# Patient Record
Sex: Female | Born: 1990 | Race: Black or African American | Hispanic: No | Marital: Single | Smoking: Current some day smoker
Health system: Southern US, Community
[De-identification: ages and names within clinical notes are randomized; demographics above are authoritative.]

## PROBLEM LIST (undated history)

## (undated) DIAGNOSIS — N739 Female pelvic inflammatory disease, unspecified: Secondary | ICD-10-CM

---
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
## 2013-07-13 ENCOUNTER — Emergency Department (HOSPITAL_BASED_OUTPATIENT_CLINIC_OR_DEPARTMENT_OTHER)
Admission: EM | Admit: 2013-07-13 | Discharge: 2013-07-13 | Disposition: A | Discharge status: Home / Self Care | Payer: Self-pay | Attending: Emergency Medicine | Admitting: Emergency Medicine

## 2013-07-13 ENCOUNTER — Encounter (HOSPITAL_BASED_OUTPATIENT_CLINIC_OR_DEPARTMENT_OTHER): Payer: Self-pay | Admitting: Emergency Medicine

## 2013-07-13 DIAGNOSIS — N643 Galactorrhea not associated with childbirth: Secondary | ICD-10-CM

## 2013-07-13 DIAGNOSIS — F17200 Nicotine dependence, unspecified, uncomplicated: Secondary | ICD-10-CM | POA: Insufficient documentation

## 2013-07-13 DIAGNOSIS — Z3202 Encounter for pregnancy test, result negative: Secondary | ICD-10-CM | POA: Insufficient documentation

## 2013-07-13 LAB — URINALYSIS, ROUTINE W REFLEX MICROSCOPIC
Ketones, ur: NEGATIVE mg/dL
Nitrite: NEGATIVE
Specific Gravity, Urine: 1.025 (ref 1.005–1.030)
pH: 6 (ref 5.0–8.0)

## 2013-07-13 LAB — URINE MICROSCOPIC-ADD ON

## 2013-07-13 NOTE — ED Notes (Signed)
Pt c/o breast tenderness and leakage x 2 weeks

## 2013-07-13 NOTE — ED Provider Notes (Signed)
CSN: [id number]     Arrival date & time 07/13/13  1336 History   First MD Initiated Contact with Patient 07/13/13 1434     Chief Complaint  Patient presents with  . Breast Discharge   (Consider location/radiation/quality/duration/timing/severity/associated sxs/prior Treatment) HPI Comments: Pt complains of breast feeling tight and drainage from her breast.  Pt reports drainage when she squeezes breast.  Pt denies fever or chills. No vaginal discharge.  Not pregnant.    The history is provided by the patient. No language interpreter was used.    History reviewed. No pertinent past medical history. History reviewed. No pertinent past surgical history. History reviewed. No pertinent family history. History  Substance Use Topics  . Smoking status: Current Every Day Smoker    Types: Cigarettes  . Smokeless tobacco: Not on file  . Alcohol Use: No   OB History   Grav Para Term Preterm Abortions TAB SAB Ect Mult Living                 Review of Systems  All other systems reviewed and are negative.    Allergies  Review of patient's allergies indicates no known allergies.  Home Medications  No current outpatient prescriptions on file. BP 125/63  Pulse 82  Temp(Src) 97.9 F (36.6 C) (Oral)  Resp 16  Ht 5\' 3"  (1.6 m)  Wt 150 lb (68.04 kg)  BMI 26.58 kg/m2  SpO2 100%  LMP 07/10/2013 Physical Exam  Nursing note and vitals reviewed. Constitutional: She appears well-developed and well-nourished.  Eyes: Conjunctivae and EOM are normal. Pupils are equal, round, and reactive to light.  Neck: Normal range of motion. Neck supple.  Cardiovascular: Normal rate and normal heart sounds.   Pulmonary/Chest: Effort normal.  Abdominal: Soft. Bowel sounds are normal.  Musculoskeletal: Normal range of motion.  Small amount of clear drainage. clear  Neurological: She is alert.  Skin: Skin is warm.    ED Course  Procedures (including critical care time) Labs Review Labs Reviewed   URINALYSIS, ROUTINE W REFLEX MICROSCOPIC - Abnormal; Notable for the following:    APPearance CLOUDY (*)    Hgb urine dipstick LARGE (*)    Leukocytes, UA SMALL (*)    All other components within normal limits  URINE MICROSCOPIC-ADD ON - Abnormal; Notable for the following:    Squamous Epithelial / LPF FEW (*)    Bacteria, UA MANY (*)    All other components within normal limits  PREGNANCY, URINE   Imaging Review No results found.  EKG Interpretation   None       MDM   1. Galactorrhea    Pt referred to breast center.     Elson Areas, PA-C 07/13/13 1522  Lonia Skinner [place], [place] 07/13/13 [phone]

## 2013-07-13 NOTE — ED Provider Notes (Signed)
Medical screening examination/treatment/procedure(s) were performed by non-physician practitioner and as supervising physician I was immediately available for consultation/collaboration.  EKG Interpretation   None         Shanna Cisco, MD 07/13/13 1537

## 2013-11-11 DIAGNOSIS — N739 Female pelvic inflammatory disease, unspecified: Secondary | ICD-10-CM

## 2013-11-11 HISTORY — DX: Female pelvic inflammatory disease, unspecified: N73.9

## 2014-03-27 ENCOUNTER — Encounter (HOSPITAL_BASED_OUTPATIENT_CLINIC_OR_DEPARTMENT_OTHER): Payer: Self-pay | Admitting: Emergency Medicine

## 2014-03-27 ENCOUNTER — Emergency Department (HOSPITAL_BASED_OUTPATIENT_CLINIC_OR_DEPARTMENT_OTHER)
Admission: EM | Admit: 2014-03-27 | Discharge: 2014-03-27 | Disposition: A | Discharge status: Home / Self Care | Payer: Self-pay | Attending: Emergency Medicine | Admitting: Emergency Medicine

## 2014-03-27 DIAGNOSIS — F17200 Nicotine dependence, unspecified, uncomplicated: Secondary | ICD-10-CM | POA: Insufficient documentation

## 2014-03-27 DIAGNOSIS — R42 Dizziness and giddiness: Secondary | ICD-10-CM | POA: Insufficient documentation

## 2014-03-27 DIAGNOSIS — R61 Generalized hyperhidrosis: Secondary | ICD-10-CM | POA: Insufficient documentation

## 2014-03-27 DIAGNOSIS — Z3202 Encounter for pregnancy test, result negative: Secondary | ICD-10-CM | POA: Insufficient documentation

## 2014-03-27 LAB — CBC WITH DIFFERENTIAL/PLATELET
BASOS ABS: 0.1 10*3/uL (ref 0.0–0.1)
Basophils Relative: 0 % (ref 0–1)
EOS ABS: 0.4 10*3/uL (ref 0.0–0.7)
EOS PCT: 3 % (ref 0–5)
HEMATOCRIT: 37.1 % (ref 36.0–46.0)
Hemoglobin: 12.2 g/dL (ref 12.0–15.0)
LYMPHS ABS: 3.4 10*3/uL (ref 0.7–4.0)
LYMPHS PCT: 28 % (ref 12–46)
MCH: 28.1 pg (ref 26.0–34.0)
MCHC: 32.9 g/dL (ref 30.0–36.0)
MCV: 85.5 fL (ref 78.0–100.0)
MONO ABS: 0.9 10*3/uL (ref 0.1–1.0)
Monocytes Relative: 7 % (ref 3–12)
Neutro Abs: 7.4 10*3/uL (ref 1.7–7.7)
Neutrophils Relative %: 62 % (ref 43–77)
PLATELETS: 225 10*3/uL (ref 150–400)
RBC: 4.34 MIL/uL (ref 3.87–5.11)
RDW: 13.7 % (ref 11.5–15.5)
WBC: 12.1 10*3/uL — AB (ref 4.0–10.5)

## 2014-03-27 LAB — BASIC METABOLIC PANEL
Anion gap: 10 (ref 5–15)
BUN: 6 mg/dL (ref 6–23)
CALCIUM: 9.6 mg/dL (ref 8.4–10.5)
CO2: 25 meq/L (ref 19–32)
CREATININE: 0.8 mg/dL (ref 0.50–1.10)
Chloride: 104 mEq/L (ref 96–112)
GFR calc Af Amer: >90 mL/min (ref >90)
GLUCOSE: 94 mg/dL (ref 70–99)
Potassium: 3.8 mEq/L (ref 3.7–5.3)
Sodium: 139 mEq/L (ref 137–147)

## 2014-03-27 LAB — URINALYSIS, ROUTINE W REFLEX MICROSCOPIC
BILIRUBIN URINE: NEGATIVE
Glucose, UA: NEGATIVE mg/dL
Ketones, ur: NEGATIVE mg/dL
NITRITE: NEGATIVE
PH: 5 (ref 5.0–8.0)
Protein, ur: NEGATIVE mg/dL
SPECIFIC GRAVITY, URINE: 1.029 (ref 1.005–1.030)
UROBILINOGEN UA: 0.2 mg/dL (ref 0.0–1.0)

## 2014-03-27 LAB — URINE MICROSCOPIC-ADD ON

## 2014-03-27 LAB — PREGNANCY, URINE: Preg Test, Ur: NEGATIVE

## 2014-03-27 MED ORDER — SODIUM CHLORIDE 0.9 % IV BOLUS (SEPSIS)
1000.0000 mL | Freq: Once | INTRAVENOUS | Status: AC
  Administered 2014-03-27: 1000 mL via INTRAVENOUS

## 2014-03-27 NOTE — ED Notes (Signed)
Dizziness while at work today. Drove herself here.

## 2014-03-27 NOTE — ED Provider Notes (Signed)
CSN: [id number]     Arrival date & time 03/27/14  1725 History   First MD Initiated Contact with Patient 03/27/14 1802     Chief Complaint  Patient presents with  . Dizziness     (Consider location/radiation/quality/duration/timing/severity/associated sxs/prior Treatment) HPI Comments: Nancy Harper is a 23 y.o. female with no significant PMHx who presents to the ED with complaints of dizziness that occurred at work PTA. Pt states she felt light headed and "hot" as well as "sweaty" when she was standing at work, which resolved after arriving to the ED and laying down approx 1 hr after it began. States she used a cold towel behind her neck which helped her feel better. Has never had an episode like this before. 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 States she has not been drinking much water recently. Ate a biscuit this morning and then a can of vienna sausages prior to arriving at work but has not eaten any other items during the day today. Denies any fevers, chills, HA, CP, SOB, palpitations, cough, ongoing URI symptoms, ear pain/discharge, tinnitus, vertigo, vision changes, abd pain, N/V/D/C, hematuria, dysuria, vaginal discharge/bleeding, weakness, or numbness. Denies LOC or syncopal episode although she states when this occurred she felt her vision was tunneling but it quickly resolved when she sat down. Denies ETOH or drug use. Denies current pregnancy.   Patient is a 23 y.o. female presenting with dizziness. The history is provided by the patient. No language interpreter was used.  Dizziness Quality:  Lightheadedness Severity:  Mild Onset quality:  Sudden Duration:  1 hour Progression:  Resolved Chronicity:  New Context: physical activity and standing up   Context: not when bending over, not with bowel movement, not with ear pain, not with eye movement, not with head  movement, not with inactivity, not with loss of consciousness, not with medication and not when urinating   Relieved by:  Lying down Worsened by:  Standing up Ineffective treatments:  None tried Associated symptoms: no blood in stool, no chest pain, no diarrhea, no headaches, no hearing loss, no nausea, no palpitations, no shortness of breath, no syncope, no tinnitus, no vision changes, no vomiting and no weakness   Risk factors: no anemia, no hx of vertigo and no new medications     History reviewed. No pertinent past medical history. History reviewed. No pertinent past surgical history. No family history on file. History  Substance Use Topics  . Smoking status: Current Every Day Smoker    Types: Cigarettes  . Smokeless tobacco: Not on file  . Alcohol Use: No   OB History   Grav Para Term Preterm Abortions TAB SAB Ect Mult Living                 Review of Systems  Constitutional: Positive for diaphoresis (during lightheaded episode, now resolved). Negative for fever, chills and fatigue.  HENT: Negative for ear discharge, ear pain, hearing loss, rhinorrhea, sinus pressure, sore throat and tinnitus.   Eyes: Negative for visual disturbance.  Respiratory: Negative for cough, chest tightness and shortness of breath.   Cardiovascular: Negative for chest pain, palpitations and syncope.  Gastrointestinal: Negative for nausea, vomiting, abdominal pain, diarrhea, constipation and blood in stool.  Genitourinary: Negative for dysuria, urgency, frequency, hematuria, flank pain, decreased urine volume, vaginal bleeding, vaginal discharge, menstrual problem and pelvic pain.  Musculoskeletal: Negative  for arthralgias, back pain, joint swelling, myalgias, neck pain and neck stiffness.  Skin: Negative for rash.  Neurological: Positive for dizziness and light-headedness. Negative for syncope, facial asymmetry, weakness, numbness and headaches.  Psychiatric/Behavioral: Negative for confusion.  10  Systems reviewed and are negative for acute change except as noted in the HPI.     Allergies  Review of patient's allergies indicates no known allergies.  Home Medications   Prior to Admission medications   Not on File   BP 140/84  Pulse 70  Temp(Src) 97.9 F (36.6 C) (Oral)  Resp 20  Ht  (1.6 m)  Wt 165 lb (74.844 kg)  BMI 29.24 kg/m2  SpO2 100%  LMP 03/13/2014 Physical Exam  Nursing note and vitals reviewed. Constitutional: She is oriented to person, place, and time. Vital signs are normal. She appears well-developed and well-nourished. No distress.  NAD, VSS, afebrile  HENT:  Head: Normocephalic and atraumatic.  Mouth/Throat: Uvula is midline, oropharynx is clear and moist and mucous membranes are normal.  Eyes: Conjunctivae and EOM are normal. Pupils are equal, round, and reactive to light. Right eye exhibits no discharge. Left eye exhibits no discharge.  Neck: Normal range of motion. Neck supple. No spinous process tenderness and no muscular tenderness present. No rigidity. Normal range of motion present.  Cardiovascular: Normal rate, regular rhythm, normal heart sounds and intact distal pulses.  Exam reveals no gallop and no friction rub.   No murmur heard. Pulmonary/Chest: Effort normal and breath sounds normal. No respiratory distress. She has no decreased breath sounds. She has no wheezes. She has no rhonchi. She has no rales.  Abdominal: Soft. Normal appearance and bowel sounds are normal. She exhibits no distension. There is no tenderness. There is no rigidity, no rebound, no guarding, no CVA tenderness and no tenderness at McBurney's point.  Soft, NT/ND, +BS throughout, no r/g/r, neg mcburney's, neg CVA TTP  Musculoskeletal: Normal range of motion.  FROM in all extremities, MAE x4, gait nonataxic, strength and sensation at baseline without any deficits  Neurological: She is alert and oriented to person, place, and time. She has normal strength. No cranial nerve  deficit or sensory deficit. Coordination and gait normal.  CN 2-12 grossly intact, sensation and strength at baseline and without deficits, gait WNL  Skin: Skin is warm, dry and intact. No rash noted.  Psychiatric: She has a normal mood and affect.    ED Course  Procedures (including critical care time)  19:18:57 Orthostatic Vital Signs  Orthostatic Lying - BP- Lying: 118/63 mmHg ; Pulse- Lying: 64  Orthostatic Sitting - BP- Sitting: 115/74 mmHg ; Pulse- Sitting: 70  Orthostatic Standing at 0 minutes - BP- Standing at 0 minutes: 102/59 mmHg ; Pulse- Standing at 0 minutes: 82  21:30 Orthostatic Vital Signs  Orthostatic Lying - BP- Lying: 118/66 mmHg ; Pulse- Lying: 72  Orthostatic Sitting - BP- Sitting: 113/67 mmHg ; Pulse- Sitting: 76  Orthostatic Standing at 0 minutes - BP- Standing at 0 minutes: 105/62 mmHg ; Pulse- Standing at 0 minutes: 74   Labs Review Labs Reviewed  URINALYSIS, ROUTINE W REFLEX MICROSCOPIC - Abnormal; Notable for the following:    APPearance CLOUDY (*)    Hgb urine dipstick MODERATE (*)    Leukocytes, UA SMALL (*)    All other components within normal limits  URINE MICROSCOPIC-ADD ON - Abnormal; Notable for the following:    Squamous Epithelial / LPF FEW (*)    All other components within normal limits  CBC WITH DIFFERENTIAL - Abnormal; Notable for the following:    WBC 12.1 (*)    All other components within normal limits  PREGNANCY, URINE  BASIC METABOLIC PANEL    Imaging Review No results found.   EKG Interpretation None      MDM   Final diagnoses:  Orthostatic dizziness    23y/o female with dizziness at work resolving with laying down. No neuro deficits, neg HITS exam. Will check orthostatics and give fluids. U/A showing dirty specimen, and pt asymptomatic for urinary or vaginal complaints. Doubt UTI. Upreg neg. Will obtain basic labs to eval for any abnormalities with electrolytes or anemia. Will reassess after labs/orthostatics.  9:45  PM Orthostatic VS improved after 1L bolus, pt feels better. Without urinary complaints, doubt U/A is infectious. CBC showing WBC 12 likely from recent viral URI pt reports having last week. No anemia or lyte abnormals. Doubt any neurologic condition needing emergent imaging, doubt any cardiac abnormalities causing dizziness. Likely just dehydration. Will have pt f/up with PCP from resource guide in 1 week for ongoing management. Discussed adequate hydration. I explained the diagnosis and have given explicit precautions to return to the ER including for any other new or worsening symptoms. The patient understands and accepts the medical plan as it's been dictated and I have answered their questions. Discharge instructions concerning home care and prescriptions have been given. The patient is STABLE and is discharged to home in good condition.  BP 140/84  Pulse 70  Temp(Src) 97.9 F (36.6 C) (Oral)  Resp 20  Ht  (1.6 m)  Wt 165 lb (74.844 kg)  BMI 29.24 kg/m2  SpO2 100%  LMP 03/13/2014  Meds ordered this encounter  Medications  . sodium chloride 0.9 % bolus 1,000 mL    Sig:       [place] Pringle, PA-C 03/27/14 2223

## 2014-03-27 NOTE — ED Provider Notes (Signed)
Medical screening examination/treatment/procedure(s) were performed by non-physician practitioner and as supervising physician I was immediately available for consultation/collaboration.   EKG Interpretation None       Ethelda Chick, MD 03/27/14 2224

## 2014-03-27 NOTE — Discharge Instructions (Signed)
Stay well hydrated, and eat regular meals. Find a regular doctor to follow up with in 1 week for ongoing management of any continued dizziness. Return to the ER for changes or worsening symptoms.   Dizziness Dizziness is a common problem. It is a feeling of unsteadiness or light-headedness. You may feel like you are about to faint. Dizziness can lead to injury if you stumble or fall. A person of any age group can suffer from dizziness, but dizziness is more common in older adults. CAUSES  Dizziness can be caused by many different things, including:  Middle ear problems.  Standing for too long.  Infections.  An allergic reaction.  Aging.  An emotional response to something, such as the sight of blood.  Side effects of medicines.  Tiredness.  Problems with circulation or blood pressure.  Excessive use of alcohol or medicines, or illegal drug use.  Breathing too fast (hyperventilation).  An irregular heart rhythm (arrhythmia).  A low red blood cell count (anemia).  Pregnancy.  Vomiting, diarrhea, fever, or other illnesses that cause body fluid loss (dehydration).  Diseases or conditions such as Parkinson's disease, high blood pressure (hypertension), diabetes, and thyroid problems.  Exposure to extreme heat. DIAGNOSIS  Your health care provider will ask about your symptoms, perform a physical exam, and perform an electrocardiogram (ECG) to record the electrical activity of your heart. Your health care provider may also perform other heart or blood tests to determine the cause of your dizziness. These may include:  Transthoracic echocardiogram (TTE). During echocardiography, sound waves are used to evaluate how blood flows through your heart.  Transesophageal echocardiogram (TEE).  Cardiac monitoring. This allows your health care provider to monitor your heart rate and rhythm in real time.  Holter monitor. This is a portable device that records your heartbeat and can  help diagnose heart arrhythmias. It allows your health care provider to track your heart activity for several days if needed.  Stress tests by exercise or by giving medicine that makes the heart beat faster. TREATMENT  Treatment of dizziness depends on the cause of your symptoms and can vary greatly. HOME CARE INSTRUCTIONS   Drink enough fluids to keep your urine clear or pale yellow. This is especially important in very hot weather. In older adults, it is also important in cold weather.  Take your medicine exactly as directed if your dizziness is caused by medicines. When taking blood pressure medicines, it is especially important to get up slowly.  Rise slowly from chairs and steady yourself until you feel okay.  In the morning, first sit up on the side of the bed. When you feel okay, stand slowly while holding onto something until you know your balance is fine.  Move your legs often if you need to stand in one place for a long time. Tighten and relax your muscles in your legs while standing.  Have someone stay with you for 1-2 days if dizziness continues to be a problem. Do this until you feel you are well enough to stay alone. Have the person call your health care provider if he or she notices changes in you that are concerning.  Do not drive or use heavy machinery if you feel dizzy.  Do not drink alcohol. SEEK IMMEDIATE MEDICAL CARE IF:   Your dizziness or light-headedness gets worse.  You feel nauseous or vomit.  You have problems talking, walking, or using your arms, hands, or legs.  You feel weak.  You are not thinking clearly  or you have trouble forming sentences. It may take a friend or family member to notice this.  You have chest pain, abdominal pain, shortness of breath, or sweating.  Your vision changes.  You notice any bleeding.  You have side effects from medicine that seems to be getting worse rather than better. MAKE SURE YOU:   Understand these  instructions.  Will watch your condition.  Will get help right away if you are not doing well or get worse. Document Released: 12/24/2000 Document Revised: 07/05/2013 Document Reviewed: 01/17/2011 [facility] Patient Information 2015 [place], [place]. This information is not intended to replace advice given to you by your health care provider. Make sure you discuss any questions you have with your health care provider.  Emergency Department Resource Guide 1) Find a Doctor and Pay Out of Pocket Although you won't have to find out who is covered by your insurance plan, it is a good idea to ask around and get recommendations. You will then need to call the office and see if the doctor you have chosen will accept you as a new patient and what types of options they offer for patients who are self-pay. Some doctors offer discounts or will set up payment plans for their patients who do not have insurance, but you will need to ask so you aren't surprised when you get to your appointment.  2) Contact Your Local Health Department Not all health departments have doctors that can see patients for sick visits, but many do, so it is worth a call to see if yours does. If you don't know where your local health department is, you can check in your phone book. The CDC also has a tool to help you locate your state's health department, and many state websites also have listings of all of their local health departments.  3) Find a Walk-in Clinic If your illness is not likely to be very severe or complicated, you may want to try a walk in clinic. These are popping up all over the country in pharmacies, drugstores, and shopping centers. They're usually staffed by nurse practitioners or physician assistants that have been trained to treat common illnesses and complaints. They're usually fairly quick and inexpensive. However, if you have serious medical issues or chronic medical problems, these are probably not your best  option.  No Primary Care Doctor: - Call Health Connect at  [phone] - they can help you locate a primary care doctor that  accepts your insurance, provides certain services, etc. - Physician Referral Service- [phone]  Chronic Pain Problems: Organization         Address  Phone   Notes  [facility]  [phone] Patients need to be referred by their primary care doctor.   Medication Assistance: Organization         Address  Phone   Notes  [facility] Medication [facility] [address] [place] [place], [place] [address] [phone] --Must be a resident of [facility] -- Must have NO insurance coverage whatsoever (no Medicaid/ Medicare, etc.) -- The pt. MUST have a primary care doctor that directs their care regularly and follows them in the community   [facility]  [phone]   [employer]  [phone]    Agencies that provide inexpensive medical care: Organization         Address  Phone   Notes  [facility]  [phone]   [facility]    (  [phone]   [facility] [address] [place], [place] [address] [phone]   [facility] [address], [place] [phone]   [facility]    [phone]   [facility]    [phone]   [facility]  [address], [place] Phone:  [phone], Fax:  [phone] Hours of Operation:  9 am - 6 pm, M-F.  Also accepts Medicaid/Medicare and self-pay.  [facility]  [address], [place] Phone: [phone], Fax: [phone]. Hours of Operation:  8:30 am - 5:30 pm, M-F.  Also accepts Medicaid and self-pay.  [facility] [place] [address] [place] Phone: [phone]   [facility] [address] [place]asha Bence [place], [place] [phone], Ext. [phone] Mondays & Thursdays: 7-9 AM.  First 15  patients are seen on a first come, first serve basis.    Medicaid-accepting [facility] Providers:  Organization         Address  Phone   Notes  [facility] [address], [place] [phone] Also accepts self-pay patients.  [facility] [address]urell [place]s [place], [place]  [phone]   [facility] [address], [place] [phone]   [facility] [address], [place] [phone]   Renaye Rakers [address], [place]   [phone] Only accepts [place] patients after they have their name applied to their card.   Self-Pay (no insurance) in [facility]:  Organization         Address  Phone   Notes  Sickle Cell Patients, [facility] Internal Medicine [address] [place], [place] [phone]   [facility] [address] [place], [place] [phone]   [facility]  [address], [place] [phone]   [facility]/Dr. Osei-Bonsu  [address], [place] or [address], [place] [phone] Phone number for both [place] and [place] locations is the same.  [facility] [address], [place] [phone]   [facility] [address], [place] or [address] [phone]   [facility] [address] [place] [phone], phone; [phone], fax Sees patients 1st and 3rd Saturday of every month.  Must not qualify for public or private insurance (i.e. Medicaid, Medicare, [place] Health Choice, Veterans' Benefits)  Household income should be no more than 200% of the poverty level The clinic cannot treat you if you are pregnant or think you are pregnant  Sexually transmitted diseases are not treated at the clinic.    Dental  Care: Organization         Address  Phone  Notes  [facility] Department of [facility] [address] [place], [place] [phone] Accepts children up to age 82 who are enrolled in [place] or [place] Health Choice; pregnant women with a Medicaid card; and children who have applied for Medicaid or [place] Health Choice, but were declined, whose parents can pay a reduced fee at time of service.  [facility] Department of [facility]  [address], [place] [phone] Accepts children up  to age 9 who are enrolled in Medicaid or [place] Health Choice; pregnant women with a Medicaid card; and children who have applied for Medicaid or  Health Choice, but were declined, whose parents can pay a reduced fee at time of service.  [facility]  [address] [place], [place] [phone] Patients are seen by appointment only. Walk-ins are not accepted. [facility] will see patients 73 years of age and older. Monday - Tuesday (8am-5pm) Most Wednesdays (8:30-5pm) $30 per visit, cash only  [facility]  [address], [facility] [phone] Patients are seen by appointment only. Walk-ins are not accepted. [facility] will see patients 23 years of age and older. One Wednesday Evening (Monthly: Volunteer Based).  $30 per visit, cash only  [employer]  [phone] for adults; Children under age 59, call Graduate Pediatric Dentistry at [phone]. Children aged 53-14, please call [phone] to request a pediatric application.  Dental services are provided in all areas of dental care including fillings, crowns and bridges, complete and partial dentures, implants, gum treatment, root canals, and extractions. Preventive care is also provided. Treatment is provided to both adults and children. Patients are selected via a lottery and there is often a waiting list.   [facility] [address] [place]  [phone] [email or web address]   [facility] [address] [place], [place] [phone], Ext. [phone] Second and Fourth Thursday of each month, opens at 6:30 AM; Clinic ends at 9 AM.  Patients are seen on a first-come first-served basis, and a limited number are seen during each clinic.   [facility]  [address] [place], [place] [phone]   Eligibility Requirements You must have lived in [place], [place], or [place] counties for at least the last three months.   You cannot be eligible for state or federal sponsored [place], including [employer], [place], or [employer].   You generally cannot be eligible for healthcare insurance through your employer.    How to apply: Eligibility screenings are held every Tuesday and Wednesday afternoon from 1:00 pm until 4:00 pm. You do not need an appointment for the interview!  [facility] [address], [place], [place] [phone]   [facility]  [phone]   [facility]  [phone]   [facility] Health [facility]  [phone]

## 2014-03-27 NOTE — ED Notes (Signed)
Pt. Reports being sexually active with no birth control

## 2014-06-09 ENCOUNTER — Encounter (HOSPITAL_BASED_OUTPATIENT_CLINIC_OR_DEPARTMENT_OTHER): Payer: Self-pay

## 2014-06-09 ENCOUNTER — Inpatient Hospital Stay (HOSPITAL_BASED_OUTPATIENT_CLINIC_OR_DEPARTMENT_OTHER)
Admission: EM | Admit: 2014-06-09 | Discharge: 2014-06-11 | DRG: 759 | Disposition: A | Discharge status: Home / Self Care | Payer: Self-pay | Attending: Obstetrics & Gynecology | Admitting: Obstetrics & Gynecology

## 2014-06-09 ENCOUNTER — Emergency Department (HOSPITAL_BASED_OUTPATIENT_CLINIC_OR_DEPARTMENT_OTHER): Payer: Self-pay

## 2014-06-09 DIAGNOSIS — D649 Anemia, unspecified: Secondary | ICD-10-CM | POA: Diagnosis present

## 2014-06-09 DIAGNOSIS — B9689 Other specified bacterial agents as the cause of diseases classified elsewhere: Secondary | ICD-10-CM | POA: Diagnosis present

## 2014-06-09 DIAGNOSIS — N7093 Salpingitis and oophoritis, unspecified: Principal | ICD-10-CM | POA: Diagnosis present

## 2014-06-09 DIAGNOSIS — D72829 Elevated white blood cell count, unspecified: Secondary | ICD-10-CM | POA: Diagnosis present

## 2014-06-09 DIAGNOSIS — F17210 Nicotine dependence, cigarettes, uncomplicated: Secondary | ICD-10-CM | POA: Diagnosis present

## 2014-06-09 DIAGNOSIS — D6489 Other specified anemias: Secondary | ICD-10-CM | POA: Diagnosis present

## 2014-06-09 DIAGNOSIS — N760 Acute vaginitis: Secondary | ICD-10-CM | POA: Diagnosis present

## 2014-06-09 DIAGNOSIS — R1031 Right lower quadrant pain: Secondary | ICD-10-CM | POA: Diagnosis present

## 2014-06-09 HISTORY — DX: Female pelvic inflammatory disease, unspecified: N73.9

## 2014-06-09 LAB — URINE MICROSCOPIC-ADD ON

## 2014-06-09 LAB — CBC WITH DIFFERENTIAL/PLATELET
Basophils Absolute: 0 10*3/uL (ref 0.0–0.1)
Basophils Relative: 0 % (ref 0–1)
EOS ABS: 0 10*3/uL (ref 0.0–0.7)
Eosinophils Relative: 0 % (ref 0–5)
HCT: 34.3 % — ABNORMAL LOW (ref 36.0–46.0)
Hemoglobin: 11.4 g/dL — ABNORMAL LOW (ref 12.0–15.0)
LYMPHS PCT: 10 % — AB (ref 12–46)
Lymphs Abs: 1.3 10*3/uL (ref 0.7–4.0)
MCH: 27.9 pg (ref 26.0–34.0)
MCHC: 33.2 g/dL (ref 30.0–36.0)
MCV: 84.1 fL (ref 78.0–100.0)
Monocytes Absolute: 0.6 10*3/uL (ref 0.1–1.0)
Monocytes Relative: 4 % (ref 3–12)
NEUTROS ABS: 11.6 10*3/uL — AB (ref 1.7–7.7)
Neutrophils Relative %: 86 % — ABNORMAL HIGH (ref 43–77)
PLATELETS: 243 10*3/uL (ref 150–400)
RBC: 4.08 MIL/uL (ref 3.87–5.11)
RDW: 13.2 % (ref 11.5–15.5)
WBC: 13.5 10*3/uL — AB (ref 4.0–10.5)

## 2014-06-09 LAB — WET PREP, GENITAL
Trich, Wet Prep: NONE SEEN
YEAST WET PREP: NONE SEEN

## 2014-06-09 LAB — URINALYSIS, ROUTINE W REFLEX MICROSCOPIC
BILIRUBIN URINE: NEGATIVE
GLUCOSE, UA: NEGATIVE mg/dL
KETONES UR: 15 mg/dL — AB
Leukocytes, UA: NEGATIVE
Nitrite: NEGATIVE
PROTEIN: 30 mg/dL — AB
Specific Gravity, Urine: 1.028 (ref 1.005–1.030)
Urobilinogen, UA: 0.2 mg/dL (ref 0.0–1.0)
pH: 6.5 (ref 5.0–8.0)

## 2014-06-09 LAB — COMPREHENSIVE METABOLIC PANEL
ALT: 14 U/L (ref 0–35)
AST: 18 U/L (ref 0–37)
Albumin: 4 g/dL (ref 3.5–5.2)
Alkaline Phosphatase: 90 U/L (ref 39–117)
Anion gap: 14 (ref 5–15)
BUN: 6 mg/dL (ref 6–23)
CO2: 22 meq/L (ref 19–32)
Calcium: 9 mg/dL (ref 8.4–10.5)
Chloride: 104 mEq/L (ref 96–112)
Creatinine, Ser: 0.5 mg/dL (ref 0.50–1.10)
GFR calc non Af Amer: >90 mL/min (ref >90)
Glucose, Bld: 102 mg/dL — ABNORMAL HIGH (ref 70–99)
Potassium: 3.9 mEq/L (ref 3.7–5.3)
SODIUM: 140 meq/L (ref 137–147)
TOTAL PROTEIN: 8.1 g/dL (ref 6.0–8.3)
Total Bilirubin: 0.3 mg/dL (ref 0.3–1.2)

## 2014-06-09 LAB — LIPASE, BLOOD: Lipase: 17 U/L (ref 11–59)

## 2014-06-09 LAB — PREGNANCY, URINE: Preg Test, Ur: NEGATIVE

## 2014-06-09 MED ORDER — SODIUM CHLORIDE 0.9 % IV BOLUS (SEPSIS)
1000.0000 mL | INTRAVENOUS | Status: AC
  Administered 2014-06-09: 1000 mL via INTRAVENOUS

## 2014-06-09 MED ORDER — DIPHENHYDRAMINE HCL 50 MG/ML IJ SOLN
25.0000 mg | Freq: Once | INTRAMUSCULAR | Status: AC
  Administered 2014-06-09: 25 mg via INTRAVENOUS
  Filled 2014-06-09: qty 1

## 2014-06-09 MED ORDER — DEXTROSE 5 % IV SOLN
500.0000 mg | Freq: Once | INTRAVENOUS | Status: AC
  Administered 2014-06-09: 500 mg via INTRAVENOUS
  Filled 2014-06-09: qty 500

## 2014-06-09 MED ORDER — IOHEXOL 300 MG/ML  SOLN
25.0000 mL | Freq: Once | INTRAMUSCULAR | Status: AC | PRN
  Administered 2014-06-09: 25 mL via ORAL

## 2014-06-09 MED ORDER — CEFTRIAXONE SODIUM 1 G IJ SOLR
INTRAMUSCULAR | Status: AC
  Filled 2014-06-09: qty 10

## 2014-06-09 MED ORDER — IOHEXOL 300 MG/ML  SOLN
100.0000 mL | Freq: Once | INTRAMUSCULAR | Status: AC | PRN
  Administered 2014-06-09: 100 mL via INTRAVENOUS

## 2014-06-09 MED ORDER — DEXTROSE 5 % IV SOLN
1.0000 g | Freq: Once | INTRAVENOUS | Status: AC
  Administered 2014-06-09: 1 g via INTRAVENOUS

## 2014-06-09 MED ORDER — KETOROLAC TROMETHAMINE 30 MG/ML IJ SOLN
30.0000 mg | Freq: Once | INTRAMUSCULAR | Status: AC
  Administered 2014-06-09: 30 mg via INTRAVENOUS
  Filled 2014-06-09: qty 1

## 2014-06-09 MED ORDER — METOCLOPRAMIDE HCL 5 MG/ML IJ SOLN
10.0000 mg | Freq: Once | INTRAMUSCULAR | Status: AC
  Administered 2014-06-09: 10 mg via INTRAVENOUS
  Filled 2014-06-09: qty 2

## 2014-06-09 NOTE — ED Provider Notes (Signed)
CSN: [id number]     Arrival date & time 06/09/14  1359 History   First MD Initiated Contact with Patient 06/09/14 1532     Chief Complaint  Patient presents with  . Abdominal Pain   (Consider location/radiation/quality/duration/timing/severity/associated sxs/prior Treatment) HPI  Nancy Harper is a 23 yo female presenting with report of nausea vomiting and abd pain.  She reports yesterday while eating dinner she began feeling nauseated and vomited x 1 episode. This am she woke up with right sided abd pain that radiated to her right flank and back. She describes this pain as constant and sharp pain, and rates it 8-9/10.  She notes walking seems to make it better, and lying on right side make it worse.  She reports vomitied x 3 times today. Last bowel movement was today and soft.  She is currently on her menstrual period.  She also reports a history of an abdominal abscess 8 months ago that required [facility]x.  She denies gross hematuria, fever or chills, vaginal symptoms or dysuria.    History reviewed. No pertinent past medical history. Past Surgical History  Procedure Laterality Date  . Vaginal septum resection    . Abdominal surgery     No family history on file. History  Substance Use Topics  . Smoking status: Current Every Day Smoker    Types: Cigarettes  . Smokeless tobacco: Not on file  . Alcohol Use: Yes   OB History    No data available     Review of Systems  Constitutional: Negative for fever and chills.  HENT: Negative for sore throat.   Eyes: Negative for visual disturbance.  Respiratory: Negative for cough and shortness of breath.   Cardiovascular: Negative for chest pain and leg swelling.  Gastrointestinal: Positive for nausea, vomiting and abdominal pain. Negative for diarrhea.  Genitourinary: Positive for flank pain. Negative for dysuria.  Musculoskeletal: Positive for back pain. Negative for myalgias.  Skin: Negative for rash.    Neurological: Negative for weakness, numbness and headaches.      Allergies  Review of patient's allergies indicates no known allergies.  Home Medications   Prior to Admission medications   Not on File   BP 119/75 mmHg  Pulse 79  Temp(Src) 98.4 F (36.9 C) (Oral)  Resp 18  Ht 5\' 3"  (1.6 m)  Wt 168 lb (76.204 kg)  BMI 29.77 kg/m2  SpO2 100%  LMP 06/08/2014 Physical Exam  Constitutional: She appears well-developed and well-nourished. No distress.  HENT:  Head: Normocephalic and atraumatic.  Mouth/Throat: Oropharynx is clear and moist. No oropharyngeal exudate.  Eyes: Conjunctivae are normal.  Neck: Neck supple. No thyromegaly present.  Cardiovascular: Normal rate, regular rhythm and intact distal pulses.   Pulmonary/Chest: Effort normal and breath sounds normal. No respiratory distress. She has no wheezes. She has no rales. She exhibits no tenderness.  Abdominal: Soft. She exhibits no distension and no mass. There is tenderness in the suprapubic area. There is CVA tenderness. There is no rigidity, no rebound, no guarding, no tenderness at McBurney's point and negative Murphy's sign.    Genitourinary: Vagina normal. Cervix exhibits no motion tenderness. Right adnexum displays no tenderness. Left adnexum displays no tenderness. No vaginal discharge found.  Musculoskeletal: She exhibits no tenderness.  Lymphadenopathy:    She has no cervical adenopathy.  Neurological: She is alert.  Skin: Skin is warm and dry. No rash noted. She is not diaphoretic.  Psychiatric: She has a normal mood and affect.  Nursing note and vitals reviewed.   ED Course  Procedures (including critical care time) Labs Review Labs Reviewed  URINALYSIS, ROUTINE W REFLEX MICROSCOPIC - Abnormal; Notable for the following:    Color, Urine RED (*)    APPearance CLOUDY (*)    Hgb urine dipstick LARGE (*)    Ketones, ur 15 (*)    Protein, ur 30 (*)    All other components within normal limits  URINE  MICROSCOPIC-ADD ON - Abnormal; Notable for the following:    Bacteria, UA FEW (*)    All other components within normal limits  CBC WITH DIFFERENTIAL - Abnormal; Notable for the following:    WBC 13.5 (*)    Hemoglobin 11.4 (*)    HCT 34.3 (*)    Neutrophils Relative % 86 (*)    Neutro Abs 11.6 (*)    Lymphocytes Relative 10 (*)    All other components within normal limits  COMPREHENSIVE METABOLIC PANEL - Abnormal; Notable for the following:    Glucose, Bld 102 (*)    All other components within normal limits  PREGNANCY, URINE  LIPASE, BLOOD    Imaging Review Ct Abdomen Pelvis W Contrast  06/09/2014   CLINICAL DATA:  23 year old female with right-sided abdominal pain accompanied by nausea and vomiting.  EXAM: CT ABDOMEN AND PELVIS WITH CONTRAST  TECHNIQUE: Multidetector CT imaging of the abdomen and pelvis was performed using the standard protocol following bolus administration of intravenous contrast.  CONTRAST:  25mL OMNIPAQUE IOHEXOL 300 MG/ML SOLN, OMNIPAQUE IOHEXOL 300 MG/ML SOLN  COMPARISON:  Prior CT scan of the abdomen and pelvis 09/19/2013  FINDINGS: Lower Chest: The lung bases are clear. Visualized cardiac structures are within normal limits for size. No pericardial effusion. Unremarkable visualized distal thoracic esophagus.  Abdomen: Unremarkable CT appearance of the stomach, duodenum, spleen, adrenal glands and pancreas. Normal hepatic contour and morphology. No discrete hepatic lesion. Gallbladder is unremarkable. No intra or extrahepatic biliary ductal dilatation. Unremarkable appearance of the bilateral kidneys. No focal solid lesion, hydronephrosis or nephrolithiasis. No evidence of obstruction or focal bowel wall thickening. Normal appendix in the right lower quadrant. The terminal ileum is unremarkable.  Pelvis: Complex peripherally enhancing tubular fluid collection centered in the region of the right adnexum most consistent with a hydro or pyosalpinx. Given the  relative hyper enhancement of the mucosal lining, pyosalpinx is favored. There is an inseparable 5.2 x 4.7 x 5.9 cm intermediate attenuation cystic collection. This appears to be arising from the ovary. There is a separate small peripherally enhancing fluid collection also within the ovary which may represent a corpus luteum. There is a smaller tubular fluid collection in the left aspect of the anatomic pelvis and a smaller 2.8 x 2.2 cm cystic collection in the left ovary. The uterus itself is unremarkable. No free fluid.  Bones/Soft Tissues: No acute fracture or aggressive appearing lytic or blastic osseous lesion.  Vascular: No significant atherosclerotic vascular disease, aneurysmal dilatation or acute abnormality.  IMPRESSION: 1. CT findings are most consistent with bilateral right worse than left tubo-ovarian abscesses. Consider further evaluation with pelvic ultrasound. 2. The appendix is normal.   Electronically Signed   By: Malachy Moan M.D.   On: 06/09/2014 19:21     EKG Interpretation None      MDM   Final diagnoses:  Abdominal pain, RLQ  TOA (tubo-ovarian abscess)   23 yo female with RLQ abd pain and vomiting.  CBC, CMP, Lipase, UA, Upreg, NS bolus and pain meds.  Discussed case with Dr. Rosalia Hammersay.  Will also perform pelvic exam and CT abd/pelvis  Labs reviewed, WBC elevated: 13.5.  CT resulted: bilat tubo-ovarian abscess. IV rocephin and azithromycin ordered.  Consulted Dr. Criselda PeachesMullen (hospitalist) for admission, pt accepted [facility]. The patient appears reasonably stabilized for admission considering the current resources, flow, and capabilities available in the ED at this time, and I doubt any further screening and/or treatment in the ED prior to admission.     Filed Vitals:   06/09/14 1417 06/09/14 1634 06/09/14 1952 06/09/14 2023  BP: 119/75 124/61 109/62 111/60  Pulse: 79 83 69 79  Temp: 98.4 F (36.9 C)  98.2 F (36.8 C) 98.2 F (36.8 C)  TempSrc: Oral  Oral  Oral  Resp: 18 18 16 18   Height: 5\' 3"  (1.6 m)     Weight: 168 lb (76.204 kg)     SpO2: 100% 100% 100% 99%   Meds given in ED:  Medications  sodium chloride 0.9 % bolus 1,000 mL (1,000 mLs Intravenous New Bag/Given 06/09/14 1655)  ketorolac (TORADOL) 30 MG/ML injection 30 mg (30 mg Intravenous Given 06/09/14 1655)  metoCLOPramide (REGLAN) injection 10 mg (10 mg Intravenous Given 06/09/14 1656)  diphenhydrAMINE (BENADRYL) injection 25 mg (25 mg Intravenous Given 06/09/14 1656)    New Prescriptions   No medications on file       Harle BattiestElizabeth Trini Christiansen, NP 06/14/14 1215  Hilario Quarryanielle S Ray, MD 06/17/14 [id number]

## 2014-06-09 NOTE — ED Notes (Signed)
C/o abd pain, right flank pain x today

## 2014-06-09 NOTE — Progress Notes (Signed)
Report taken from PeostaMisty, RN at [facility].

## 2014-06-09 NOTE — Progress Notes (Signed)
Patient arrived to 5w30 via carelink. Patient alert and oriented x 4. In no distress. Skin intact. No complaints of pain at this time. IV intact. Pt oriented to room and unit. Call bell within reach, pt has understanding of how to get into contact with nursing staff. [facility]. Awaiting MD orders.

## 2014-06-09 NOTE — Progress Notes (Signed)
Called re: Admission for Nancy Harper.   Patient with bilateral R worse than left tubo-ovarian abscess on CT scan.  Started on Abx.  Vital signs stable.  WBC 13.5.  [facility].  First line Abx would be 2nd gen cephalosporin and doxycycline.    Debe CoderMULLEN, Rotunda Worden, MD

## 2014-06-10 ENCOUNTER — Encounter (HOSPITAL_COMMUNITY): Payer: Self-pay | Admitting: *Deleted

## 2014-06-10 ENCOUNTER — Inpatient Hospital Stay (HOSPITAL_COMMUNITY): Payer: Self-pay

## 2014-06-10 DIAGNOSIS — D649 Anemia, unspecified: Secondary | ICD-10-CM

## 2014-06-10 DIAGNOSIS — N760 Acute vaginitis: Secondary | ICD-10-CM | POA: Diagnosis present

## 2014-06-10 DIAGNOSIS — B9689 Other specified bacterial agents as the cause of diseases classified elsewhere: Secondary | ICD-10-CM | POA: Diagnosis present

## 2014-06-10 DIAGNOSIS — A499 Bacterial infection, unspecified: Secondary | ICD-10-CM

## 2014-06-10 DIAGNOSIS — D72829 Elevated white blood cell count, unspecified: Secondary | ICD-10-CM

## 2014-06-10 DIAGNOSIS — N7093 Salpingitis and oophoritis, unspecified: Principal | ICD-10-CM

## 2014-06-10 DIAGNOSIS — R1031 Right lower quadrant pain: Secondary | ICD-10-CM

## 2014-06-10 LAB — BASIC METABOLIC PANEL
ANION GAP: 14 (ref 5–15)
BUN: 3 mg/dL — AB (ref 6–23)
CALCIUM: 8.1 mg/dL — AB (ref 8.4–10.5)
CHLORIDE: 105 meq/L (ref 96–112)
CO2: 20 meq/L (ref 19–32)
CREATININE: 0.51 mg/dL (ref 0.50–1.10)
GFR calc Af Amer: >90 mL/min (ref >90)
GFR calc non Af Amer: >90 mL/min (ref >90)
Glucose, Bld: 157 mg/dL — ABNORMAL HIGH (ref 70–99)
Potassium: 3.3 mEq/L — ABNORMAL LOW (ref 3.7–5.3)
Sodium: 139 mEq/L (ref 137–147)

## 2014-06-10 LAB — CBC
HEMATOCRIT: 32.7 % — AB (ref 36.0–46.0)
HEMOGLOBIN: 10.9 g/dL — AB (ref 12.0–15.0)
MCH: 28.4 pg (ref 26.0–34.0)
MCHC: 33.3 g/dL (ref 30.0–36.0)
MCV: 85.2 fL (ref 78.0–100.0)
Platelets: 228 10*3/uL (ref 150–400)
RBC: 3.84 MIL/uL — ABNORMAL LOW (ref 3.87–5.11)
RDW: 13.7 % (ref 11.5–15.5)
WBC: 11.1 10*3/uL — ABNORMAL HIGH (ref 4.0–10.5)

## 2014-06-10 MED ORDER — METRONIDAZOLE 500 MG PO TABS
500.0000 mg | ORAL_TABLET | Freq: Two times a day (BID) | ORAL | Status: DC
  Administered 2014-06-10 (×2): 500 mg via ORAL
  Filled 2014-06-10 (×3): qty 1

## 2014-06-10 MED ORDER — IBUPROFEN 600 MG PO TABS: 600.0000 mg | ORAL_TABLET | Freq: Four times a day (QID) | ORAL | Status: DC | PRN

## 2014-06-10 MED ORDER — ACETAMINOPHEN 650 MG RE SUPP: 650.0000 mg | Freq: Four times a day (QID) | RECTAL | Status: DC | PRN

## 2014-06-10 MED ORDER — DEXTROSE 5 % IV SOLN
2.0000 g | Freq: Two times a day (BID) | INTRAVENOUS | Status: DC
  Administered 2014-06-10: 2 g via INTRAVENOUS
  Filled 2014-06-10 (×2): qty 2

## 2014-06-10 MED ORDER — SODIUM CHLORIDE 0.9 % IJ SOLN: 3.0000 mL | Freq: Two times a day (BID) | INTRAMUSCULAR | Status: DC

## 2014-06-10 MED ORDER — OXYCODONE HCL 5 MG PO TABS
5.0000 mg | ORAL_TABLET | ORAL | Status: DC | PRN
  Administered 2014-06-10: 5 mg via ORAL
  Filled 2014-06-10: qty 1

## 2014-06-10 MED ORDER — ONDANSETRON HCL 4 MG/2ML IJ SOLN: 4.0000 mg | Freq: Four times a day (QID) | INTRAMUSCULAR | Status: DC | PRN

## 2014-06-10 MED ORDER — ONDANSETRON HCL 4 MG PO TABS: 4.0000 mg | ORAL_TABLET | Freq: Four times a day (QID) | ORAL | Status: DC | PRN

## 2014-06-10 MED ORDER — DIPHENHYDRAMINE HCL 50 MG/ML IJ SOLN: 25.0000 mg | Freq: Four times a day (QID) | INTRAMUSCULAR | Status: DC | PRN

## 2014-06-10 MED ORDER — ACETAMINOPHEN 325 MG PO TABS
650.0000 mg | ORAL_TABLET | Freq: Four times a day (QID) | ORAL | Status: DC | PRN
  Administered 2014-06-10: 650 mg via ORAL
  Filled 2014-06-10: qty 2

## 2014-06-10 MED ORDER — ENOXAPARIN SODIUM 40 MG/0.4ML ~~LOC~~ SOLN
40.0000 mg | Freq: Every day | SUBCUTANEOUS | Status: DC
  Filled 2014-06-10: qty 0.4

## 2014-06-10 MED ORDER — METRONIDAZOLE 500 MG PO TABS
500.0000 mg | ORAL_TABLET | Freq: Three times a day (TID) | ORAL | Status: DC
  Administered 2014-06-10 (×2): 500 mg via ORAL
  Filled 2014-06-10 (×5): qty 1

## 2014-06-10 MED ORDER — PIPERACILLIN-TAZOBACTAM 3.375 G IVPB
3.3750 g | Freq: Three times a day (TID) | INTRAVENOUS | Status: DC
  Administered 2014-06-10 – 2014-06-11 (×3): 3.375 g via INTRAVENOUS
  Filled 2014-06-10 (×6): qty 50

## 2014-06-10 MED ORDER — SODIUM CHLORIDE 0.9 % IJ SOLN: 3.0000 mL | INTRAMUSCULAR | Status: DC | PRN

## 2014-06-10 MED ORDER — IBUPROFEN 600 MG PO TABS
600.0000 mg | ORAL_TABLET | Freq: Four times a day (QID) | ORAL | Status: DC | PRN
  Administered 2014-06-10: 600 mg via ORAL
  Filled 2014-06-10: qty 1

## 2014-06-10 MED ORDER — OXYCODONE HCL 5 MG PO TABS: 5.0000 mg | ORAL_TABLET | ORAL | Status: DC | PRN

## 2014-06-10 MED ORDER — SODIUM CHLORIDE 0.9 % IV SOLN: 250.0000 mL | INTRAVENOUS | Status: DC | PRN

## 2014-06-10 MED ORDER — DEXTROSE 5 % IV SOLN
100.0000 mg | Freq: Two times a day (BID) | INTRAVENOUS | Status: DC
  Administered 2014-06-10: 100 mg via INTRAVENOUS
  Filled 2014-06-10 (×2): qty 100

## 2014-06-10 MED ORDER — HYDROMORPHONE HCL 1 MG/ML IJ SOLN: 0.5000 mg | INTRAMUSCULAR | Status: DC | PRN

## 2014-06-10 MED ORDER — ALUM & MAG HYDROXIDE-SIMETH 200-200-20 MG/5ML PO SUSP: 30.0000 mL | Freq: Four times a day (QID) | ORAL | Status: DC | PRN

## 2014-06-10 MED ORDER — POTASSIUM CHLORIDE CRYS ER 20 MEQ PO TBCR: 40.0000 meq | EXTENDED_RELEASE_TABLET | Freq: Once | ORAL | Status: DC

## 2014-06-10 MED ORDER — SODIUM CHLORIDE 0.9 % IV SOLN
INTRAVENOUS | Status: DC
  Administered 2014-06-10: 01:00:00 via INTRAVENOUS

## 2014-06-10 MED ORDER — METOCLOPRAMIDE HCL 5 MG/ML IJ SOLN: 10.0000 mg | Freq: Four times a day (QID) | INTRAMUSCULAR | Status: DC | PRN

## 2014-06-10 NOTE — Plan of Care (Signed)
Problem: Phase I Progression Outcomes Goal: Pain controlled with appropriate interventions Outcome: Progressing     

## 2014-06-10 NOTE — H&P (Signed)
[facility]N Attending History and Physical  Reason for Admission: Bilateral Tuboovarian Abscesses (TOA)  Nancy Harper is a 23 y.o. G0 [facility] our service here at [facility] for further evaluation and management of bilateral TOA.  Patient [facility] yested[facility] with severe R sided abdominal and pelvic pain associated with nausea and vomiting.  No fevers, chills, vaginal discharge or other associated symptoms.  On evaluation, she was found to have bilateral TOA (R>L).  She was given Ceftriaxone and Azithromycin and [facility] where her antibiotic therapy was switched to Cefotetan and Doxycycline; then changed to Zosyn after GYN consultation.  The decision was [facility].  GYN History is remarkable for [facility] for pelvic abscess for which she underwent IR drainage ("drain was placed out my left buttock", and was treated with IV antibiotics.   Also had vaginal septum [facility].   [facility], patient reports improved pain symptoms since antibiotic treatment. No current other concerning symptoms.   Past Medical History  Diagnosis Date  . Pelvic abscess in female 11/2013    [facility], underwent IR drainage     Past Surgical History  Procedure Laterality Date  . Vaginal septum resection  2003    [facility]   Obstetric/Gynecologic History G0. Patient denies any cervical dysplasia or STIs. Regular menses.  No current facility-administered medications on file prior to encounter.   No current outpatient prescriptions on file prior to encounter.   No Known Allergies   Social History:   reports that she has been smoking Cigarettes.  She has been smoking about 0.00 packs per day. She does not have any smokeless tobacco history on  file. She reports that she drinks about 0.6 oz of alcohol per week. She reports that she does not use illicit drugs.  No family history on file.  Review of Systems: Noncontributory  PHYSICAL EXAM: Blood pressure 112/63, pulse 70, temperature 98.5 F (36.9 C), temperature source Oral, resp. rate 18, height 5\' 3"  (1.6 m), weight 167 lb 8.8 oz (76 kg), last menstrual period 06/08/2014, SpO2 99 %. General appearance - alert, well appearing, and in no distress Chest - clear to auscultation, no wheezes, rales or rhonchi, symmetric air entry Heart - normal rate and regular rhythm Abdomen - soft, nontender,  nondistended, no masses or organomegaly Pelvic - examination not indicated Extremities - peripheral pulses normal, no pedal edema, no clubbing or cyanosis  Labs: Results for orders placed or performed during the hospital encounter of 06/09/14 (from the past 336 hour(s))  Urinalysis, Routine w reflex microscopic   Collection Time: 06/09/14  2:29 PM  Result Value Ref Range   Color, Urine RED (A) YELLOW   APPearance CLOUDY (A) CLEAR   Specific Gravity, Urine 1.028 1.005 - 1.030   pH 6.5 5.0 - 8.0   Glucose, UA NEGATIVE NEGATIVE mg/dL   Hgb urine dipstick LARGE (A) NEGATIVE   Bilirubin Urine NEGATIVE NEGATIVE   Ketones, ur 15 (A) NEGATIVE mg/dL   Protein, ur 30 (A) NEGATIVE mg/dL   Urobilinogen, UA 0.2 0.0 - 1.0 mg/dL   Nitrite NEGATIVE NEGATIVE   Leukocytes, UA NEGATIVE NEGATIVE  Pregnancy, urine   Collection Time: 06/09/14  2:29 PM  Result Value Ref  Range   Preg Test, Ur NEGATIVE NEGATIVE  Urine microscopic-add on   Collection Time: 06/09/14  2:29 PM  Result Value Ref Range   Squamous Epithelial / LPF RARE RARE   WBC, UA 3-6 <3 WBC/hpf   RBC / HPF TOO NUMEROUS TO COUNT <3 RBC/hpf   Bacteria, UA FEW (A) RARE  CBC with Differential   Collection Time: 06/09/14  4:27 PM  Result Value Ref Range   WBC 13.5 (H) 4.0 - 10.5 K/uL   RBC 4.08 3.87 - 5.11 MIL/uL   Hemoglobin 11.4 (L)  12.0 - 15.0 g/dL   HCT 40.9 (L) 81.1 - 91.4 %   MCV 84.1 78.0 - 100.0 fL   MCH 27.9 26.0 - 34.0 pg   MCHC 33.2 30.0 - 36.0 g/dL   RDW 78.2 95.6 - 21.3 %   Platelets 243 150 - 400 K/uL   Neutrophils Relative % 86 (H) 43 - 77 %   Neutro Abs 11.6 (H) 1.7 - 7.7 K/uL   Lymphocytes Relative 10 (L) 12 - 46 %   Lymphs Abs 1.3 0.7 - 4.0 K/uL   Monocytes Relative 4 3 - 12 %   Monocytes Absolute 0.6 0.1 - 1.0 K/uL   Eosinophils Relative 0 0 - 5 %   Eosinophils Absolute 0.0 0.0 - 0.7 K/uL   Basophils Relative 0 0 - 1 %   Basophils Absolute 0.0 0.0 - 0.1 K/uL  Comprehensive metabolic panel   Collection Time: 06/09/14  4:27 PM  Result Value Ref Range   Sodium 140 137 - 147 mEq/L   Potassium 3.9 3.7 - 5.3 mEq/L   Chloride 104 96 - 112 mEq/L   CO2 22 19 - 32 mEq/L   Glucose, Bld 102 (H) 70 - 99 mg/dL   BUN 6 6 - 23 mg/dL   Creatinine, Ser 0.86 0.50 - 1.10 mg/dL   Calcium 9.0 8.4 - 57.8 mg/dL   Total Protein 8.1 6.0 - 8.3 g/dL   Albumin 4.0 3.5 - 5.2 g/dL   AST 18 0 - 37 U/L   ALT 14 0 - 35 U/L   Alkaline Phosphatase 90 39 - 117 U/L   Total Bilirubin 0.3 0.3 - 1.2 mg/dL   GFR calc non Af Amer >90 >90 mL/min   GFR calc Af Amer >90 >90 mL/min   Anion gap 14 5 - 15  Lipase, blood   Collection Time: 06/09/14  4:27 PM  Result Value Ref Range   Lipase 17 11 - 59 U/L  Wet prep, genital   Collection Time: 06/09/14  7:09 PM  Result Value Ref Range   Yeast Wet Prep HPF POC NONE SEEN NONE SEEN   Trich, Wet Prep NONE SEEN NONE SEEN   Clue Cells Wet Prep HPF POC FEW (A) NONE SEEN   WBC, Wet Prep HPF POC FEW (A) NONE SEEN  Basic metabolic panel   Collection Time: 06/10/14  4:45 AM  Result Value Ref Range   Sodium 139 137 - 147 mEq/L   Potassium 3.3 (L) 3.7 - 5.3 mEq/L   Chloride 105 96 - 112 mEq/L   CO2 20 19 - 32 mEq/L   Glucose, Bld 157 (H) 70 - 99 mg/dL   BUN 3 (L) 6 - 23 mg/dL   Creatinine, Ser 4.69 0.50 - 1.10 mg/dL   Calcium 8.1 (L) 8.4 - 10.5 mg/dL   GFR calc non Af Amer >90 >90  mL/min   GFR calc Af Amer >90 >90 mL/min  Anion gap 14 5 - 15  CBC   Collection Time: 06/10/14  4:45 AM  Result Value Ref Range   WBC 11.1 (H) 4.0 - 10.5 K/uL   RBC 3.84 (L) 3.87 - 5.11 MIL/uL   Hemoglobin 10.9 (L) 12.0 - 15.0 g/dL   HCT 16.132.7 (L) 09.636.0 - 04.546.0 %   MCV 85.2 78.0 - 100.0 fL   MCH 28.4 26.0 - 34.0 pg   MCHC 33.3 30.0 - 36.0 g/dL   RDW 40.913.7 81.111.5 - 91.415.5 %   Platelets 228 150 - 400 K/uL    Imaging Studies: Ct Abdomen Pelvis W Contrast  06/09/2014   CLINICAL DATA:  23 year old female with right-sided abdominal pain accompanied by nausea and vomiting.  EXAM: CT ABDOMEN AND PELVIS WITH CONTRAST  TECHNIQUE: Multidetector CT imaging of the abdomen and pelvis was performed using the standard protocol following bolus administration of intravenous contrast.  CONTRAST:  25mL OMNIPAQUE IOHEXOL 300 MG/ML SOLN, 100mL OMNIPAQUE IOHEXOL 300 MG/ML SOLN  COMPARISON:  Prior CT scan of the abdomen and pelvis 09/19/2013  FINDINGS: Lower Chest: The lung bases are clear. Visualized cardiac structures are within normal limits for size. No pericardial effusion. Unremarkable visualized distal thoracic esophagus.  Abdomen: Unremarkable CT appearance of the stomach, duodenum, spleen, adrenal glands and pancreas. Normal hepatic contour and morphology. No discrete hepatic lesion. Gallbladder is unremarkable. No intra or extrahepatic biliary ductal dilatation. Unremarkable appearance of the bilateral kidneys. No focal solid lesion, hydronephrosis or nephrolithiasis. No evidence of obstruction or focal bowel wall thickening. Normal appendix in the right lower quadrant. The terminal ileum is unremarkable.  Pelvis: Complex peripherally enhancing tubular fluid collection centered in the region of the right adnexum most consistent with a hydro or pyosalpinx. Given the relative hyper enhancement of the mucosal lining, pyosalpinx is favored. There is an inseparable 5.2 x 4.7 x 5.9 cm intermediate attenuation cystic  collection. This appears to be arising from the ovary. There is a separate small peripherally enhancing fluid collection also within the ovary which may represent a corpus luteum. There is a smaller tubular fluid collection in the left aspect of the anatomic pelvis and a smaller 2.8 x 2.2 cm cystic collection in the left ovary. The uterus itself is unremarkable. No free fluid.  Bones/Soft Tissues: No acute fracture or aggressive appearing lytic or blastic osseous lesion.  Vascular: No significant atherosclerotic vascular disease, aneurysmal dilatation or acute abnormality.  IMPRESSION: 1. CT findings are most consistent with bilateral right worse than left tubo-ovarian abscesses. Consider further evaluation with pelvic ultrasound. 2. The appendix is normal.   Electronically Signed   By: Malachy MoanHeath  McCullough M.D.   On: 06/09/2014 19:21    Assessment: Patient Active Problem List   Diagnosis Date Noted  . Bacterial vaginosis 06/10/2014  . Normocytic anemia 06/10/2014  . TOA (tubo-ovarian abscess) 06/09/2014    Plan:  [facility] Continue Zosyn for treatment of TOA Continue Metronidazole for treatment of BV; will follow up pending labs Analgesia and antiemetics as needed Will obtain pelvic ultrasound for further characterization of the TOA Routine floor care, encourage OOB/ambulation for DVT prophylaxis   Jaynie CollinsUGONNA  Raejean Swinford, M.D. 06/10/2014 12:12 PM

## 2014-06-10 NOTE — H&P (Signed)
[facility] Admission History and Physical       Nancy Harper JWJ:[id number]:[id number] DOB: [birth year] DOA: 06/09/2014  Referring physician: EDP PCP: Norton BlizzardMATTERN, SHANNON, MD  Specialists:   Chief Complaint: Right ABD and Flank Pain  HPI: Nancy Harper is a 23 y.o. female with a history of Vaginal Septum resection 7 years ago, previous ABD Surgery for Pelvic Abscess who presents to the ED with complaints of severe right sided ABD and Flank pain along with nausea and vomiting since the AM.   She denies any fevers or chills along with vaginal discharge and dysuria.   [facility] and was found to have Bilateral Tubo-Ovarian Abscesses with the Right being greater than the left on CT of the ABD /Pelvis.  A pelvic examinatin had been performed in the ED and Cultures were sent for GC and CMZ and she was administered IV Rocephin and Azithromycin IV x 1.   She was referred for medical admission.       Review of Systems:  Constitutional: No Weight Loss, No Weight Gain, Night Sweats, Fevers, Chills, Dizziness, Fatigue, or Generalized Weakness HEENT: No Headaches, Difficulty Swallowing,Tooth/Dental Problems,Sore Throat,  No Sneezing, Rhinitis, Ear Ache, Nasal Congestion, or Post Nasal Drip,  Cardio-vascular:  No Chest pain, Orthopnea, PND, Edema in Lower Extremities, Anasarca, Dizziness, Palpitations  Resp: No Dyspnea, No DOE, No Productive Cough, No Non-Productive Cough, No Hemoptysis, No Wheezing.    GI: No Heartburn, Indigestion, +Abdominal Pain, +Nausea, +Vomiting, Diarrhea, Hematemesis, Hematochezia, Melena, Change in Bowel Habits,  Loss of Appetite  GU: No Dysuria, Change in Color of Urine, No Urgency or Frequency,  +Flank pain.  Musculoskeletal: No Joint Pain or Swelling, No Decreased Range of Motion,  +Back Pain.  Neurologic: No Syncope, No Seizures, Muscle Weakness, Paresthesia, Vision Disturbance or Loss, No Diplopia, No Vertigo, No Difficulty Walking,  Skin: No Rash or  Lesions. Psych: No Change in Mood or Affect, No Depression or Anxiety, No Memory loss, No Confusion, or Hallucinations   History reviewed. No pertinent past medical history.    Past Surgical History  Procedure Laterality Date  . Vaginal septum resection    . Abdominal surgery         Prior to Admission medications   Not on File      No Known Allergies   Social History:  reports that she has been smoking Cigarettes.  She has been smoking about 0.00 packs per day. She does not have any smokeless tobacco history on file. She reports that she drinks alcohol. She reports that she does not use illicit drugs.     No family history on file.     Physical Exam:  GEN:  Pleasant Well Nourished and Well Developed  23 y.o.  African American  female examined  and in no acute distress; cooperative with exam Filed Vitals:   06/09/14 2023 06/09/14 2207 06/09/14 2319 06/09/14 2325  BP: 111/60 96/55  110/74  Pulse: 79 81  75  Temp: 98.2 F (36.8 C) 98.4 F (36.9 C)  98.2 F (36.8 C)  TempSrc: Oral Oral  Oral  Resp: 18 18  18   Height:   5\' 3"  (1.6 m)   Weight:   76 kg (167 lb 8.8 oz)   SpO2: 99% 100%  100%   Blood pressure 110/74, pulse 75, temperature 98.2 F (36.8 C), temperature source Oral, resp. rate 18, height 5\' 3"  (1.6 m), weight 76 kg (167 lb 8.8 oz), last menstrual period 06/08/2014, SpO2 100 %.  PSYCH: She is alert and oriented x4; does not appear anxious does not appear depressed; affect is normal HEENT: Normocephalic and Atraumatic, Mucous membranes pink; PERRLA; EOM intact; Fundi:  Benign;  No scleral icterus, Nares: Patent, Oropharynx: Clear, Fair Dentition,    Neck:  FROM, No Cervical Lymphadenopathy nor Thyromegaly or Carotid Bruit; No JVD; Breasts:: Not examined CHEST WALL: No tenderness CHEST: Normal respiration, clear to auscultation bilaterally HEART: Regular rate and rhythm; no murmurs rubs or gallops BACK: No kyphosis or scoliosis; No CVA tenderness ABDOMEN:  Positive Bowel Sounds, Soft Non-Tender; No Masses, No Organomegaly Rectal Exam: Not done EXTREMITIES: No Cyanosis, Clubbing, or Edema; No Ulcerations. Genitalia: not examined PULSES: 2+ and symmetric SKIN: Normal hydration no rash or ulceration CNS:   Alert and Oriented x 4, No Focal Deficits Vascular: pulses palpable throughout    Labs on Admission:  Basic Metabolic Panel:  Recent Labs Lab 06/09/14 1627  NA 140  K 3.9  CL 104  CO2 22  GLUCOSE 102*  BUN 6  CREATININE 0.50  CALCIUM 9.0   Liver Function Tests:  Recent Labs Lab 06/09/14 1627  AST 18  ALT 14  ALKPHOS 90  BILITOT 0.3  PROT 8.1  ALBUMIN 4.0    Recent Labs Lab 06/09/14 1627  LIPASE 17   No results for input(s): AMMONIA in the last 168 hours. CBC:  Recent Labs Lab 06/09/14 1627  WBC 13.5*  NEUTROABS 11.6*  HGB 11.4*  HCT 34.3*  MCV 84.1  PLT 243   Cardiac Enzymes: No results for input(s): CKTOTAL, CKMB, CKMBINDEX, TROPONINI in the last 168 hours.  BNP (last 3 results) No results for input(s): PROBNP in the last 8760 hours. CBG: No results for input(s): GLUCAP in the last 168 hours.  Radiological Exams on Admission: Ct Abdomen Pelvis W Contrast  06/09/2014   CLINICAL DATA:  23 year old female with right-sided abdominal pain accompanied by nausea and vomiting.  EXAM: CT ABDOMEN AND PELVIS WITH CONTRAST  TECHNIQUE: Multidetector CT imaging of the abdomen and pelvis was performed using the standard protocol following bolus administration of intravenous contrast.  CONTRAST:  25mL OMNIPAQUE IOHEXOL 300 MG/ML SOLN, OMNIPAQUE IOHEXOL 300 MG/ML SOLN  COMPARISON:  Prior CT scan of the abdomen and pelvis 09/19/2013  FINDINGS: Lower Chest: The lung bases are clear. Visualized cardiac structures are within normal limits for size. No pericardial effusion. Unremarkable visualized distal thoracic esophagus.  Abdomen: Unremarkable CT appearance of the stomach, duodenum, spleen, adrenal glands and  pancreas. Normal hepatic contour and morphology. No discrete hepatic lesion. Gallbladder is unremarkable. No intra or extrahepatic biliary ductal dilatation. Unremarkable appearance of the bilateral kidneys. No focal solid lesion, hydronephrosis or nephrolithiasis. No evidence of obstruction or focal bowel wall thickening. Normal appendix in the right lower quadrant. The terminal ileum is unremarkable.  Pelvis: Complex peripherally enhancing tubular fluid collection centered in the region of the right adnexum most consistent with a hydro or pyosalpinx. Given the relative hyper enhancement of the mucosal lining, pyosalpinx is favored. There is an inseparable 5.2 x 4.7 x 5.9 cm intermediate attenuation cystic collection. This appears to be arising from the ovary. There is a separate small peripherally enhancing fluid collection also within the ovary which may represent a corpus luteum. There is a smaller tubular fluid collection in the left aspect of the anatomic pelvis and a smaller 2.8 x 2.2 cm cystic collection in the left ovary. The uterus itself is unremarkable. No free fluid.  Bones/Soft Tissues: No acute fracture or aggressive  appearing lytic or blastic osseous lesion.  Vascular: No significant atherosclerotic vascular disease, aneurysmal dilatation or acute abnormality.  IMPRESSION: 1. CT findings are most consistent with bilateral right worse than left tubo-ovarian abscesses. Consider further evaluation with pelvic ultrasound. 2. The appendix is normal.   Electronically Signed   By: Malachy MoanHeath  McCullough M.D.   On: 06/09/2014 19:21       Assessment/Plan:   23 y.o. female with   Principal Problem:   1.   TOA (tubo-ovarian abscess)   GC and CMZ GenProbe Sent and Pending   IV Cefotetan 2 grams IV q 12 hrs started   IV Doxycycline 100 mg q 12 hrs   Oral Metronidazole 500 mg PO TID      Active Problems:   2.   ABD Pain- due to #1   Pain Control PRN     3.   Bacterial vaginosis-   Covered by the  Metronidazole      4.   Leukocytosis- Due to #1      5.   Normocytic anemia- Due to Menstruation   Send Anemia Panel     6.   DVT Prophylaxis   Lovenox      Code Status:      FUL CODE Family Communication:   No Family Present  Disposition Plan:    Inpatient     Time spent:  7760 Minutes  Ron ParkerJENKINS,Cardin Nitschke C [facility] Pager [phone]   If 7AM -7PM Please Contact the Day Rounding Team MD for [facility]  If 7PM-7AM, Please Contact Night-Floor Coverage  [email or web address] Password [email or web address] 06/10/2014, 12:13 AM

## 2014-06-10 NOTE — Plan of Care (Signed)
Problem: Consults Goal: Skin Care Protocol Initiated - if Braden Score 18 or less If consults are not indicated, leave blank or document N/A  Outcome: Not Applicable Date Met:  27/51/70 Goal: Nutrition Consult-if indicated Outcome: Not Applicable Date Met:  01/74/94 Goal: Diabetes Guidelines if Diabetic/Glucose > 140 If diabetic or lab glucose is > 140 mg/dl - Initiate Diabetes/Hyperglycemia Guidelines & Document Interventions  Outcome: Not Applicable Date Met:  49/67/59  Problem: Phase I Progression Outcomes Goal: OOB as tolerated unless otherwise ordered Outcome: Completed/Met Date Met:  06/10/14 Goal: Initial discharge plan identified Outcome: Progressing Goal: Voiding-avoid urinary catheter unless indicated Outcome: Completed/Met Date Met:  06/10/14 Goal: Hemodynamically stable Outcome: Completed/Met Date Met:  06/10/14 Goal: Other Phase I Outcomes/Goals Outcome: Not Applicable Date Met:  16/38/46

## 2014-06-10 NOTE — Progress Notes (Signed)
[facility] PROGRESS NOTE  Shaaron Adlersha Cancel ZOX:[id number] DOB: Jan 31, 1991 DOA: 06/09/2014 PCP: Norton BlizzardMATTERN, SHANNON, MD   Brief narrative 23 y/o AA female with hx fo vaginal septum repair 12 years back by Dr Dyann [facility]iefYelchinkaya at [facility], ? laparoscopy for pelvic abscess 6 months back at [facility] admitted to [facility] for abdominal pain since yesterday with 3 episodes of vomiting. No fever, chills or vaginal discharge. No dysuria. [facility] and was found to have Bilateral Tubo-Ovarian Abscesses with the Right being greater than the left on CT of the ABD /Pelvis. A pelvic examinatin had been performed in the ED and Cultures were sent for GC and CMZ and she was administered IV Rocephin and Azithromycin IV x 1.   Assessment/Plan: Bilateral tuboovarian abscess Stable on medical floor. Afebrile. No  abdominal pain this am. On empiric IV cefotetan, doxycycline and flagyl . /w case with Gyn consult Dr Despina HiddenEure and [facility] and  further care. switched abx to zosyn per recommendations. Pain control with prn dilaudid - GC probe sent  [facility]   DVT prophylaxis  Diet: regular  Code Status: full code Family Communication: none at bedside [facility]   Consultants:  Spoke with Dr Despina HiddenEure  Procedures:  none  Antibiotics:  Rocephin and azithro x 1 in ED  IV cefotetan, doxy and   HPI/Subjective: Denies any abdominal pain this am. No N/V  Objective: Filed Vitals:   06/10/14 0540  BP: 118/68  Pulse: 68  Temp: 98.7 F (37.1 C)  Resp: 18   No intake or output data in the 24 hours ending 06/10/14 0842 Filed Weights   06/09/14 1417 06/09/14 2319  Weight: 76.204 kg (168 lb) 76 kg (167 lb 8.8 oz)    Exam:   General:  Young female in NAD  HENT: no pallor, moist mucosa  chest : clear b/l, no added sounds   CVS: NS1&S2, no murmurs  Abd: soft, NT, ND,  BS+  Ext: warm, no edema Data Reviewed: Basic Metabolic Panel:  Recent Labs Lab 06/09/14 1627 06/10/14 0445  NA 140 139  K 3.9 3.3*  CL 104 105  CO2 22 20  GLUCOSE 102* 157*  BUN 6 3*  CREATININE 0.50 0.51  CALCIUM 9.0 8.1*   Liver Function Tests:  Recent Labs Lab 06/09/14 1627  AST 18  ALT 14  ALKPHOS 90  BILITOT 0.3  PROT 8.1  ALBUMIN 4.0    Recent Labs Lab 06/09/14 1627  LIPASE 17   No results for input(s): AMMONIA in the last 168 hours. CBC:  Recent Labs Lab 06/09/14 1627 06/10/14 0445  WBC 13.5* 11.1*  NEUTROABS 11.6*  --   HGB 11.4* 10.9*  HCT 34.3* 32.7*  MCV 84.1 85.2  PLT 243 228   Cardiac Enzymes: No results for input(s): CKTOTAL, CKMB, CKMBINDEX, TROPONINI in the last 168 hours. BNP (last 3 results) No results for input(s): PROBNP in the last 8760 hours. CBG: No results for input(s): GLUCAP in the last 168 hours.  Recent Results (from the past 240 hour(s))  Wet prep, genital     Status: Abnormal   Collection Time: 06/09/14  7:09 PM  Result Value Ref Range Status   Yeast Wet Prep HPF POC NONE SEEN NONE SEEN Final   Trich, Wet Prep NONE SEEN NONE SEEN Final   Clue Cells Wet Prep HPF POC FEW (A) NONE SEEN Final   WBC, Wet Prep  HPF POC FEW (A) NONE SEEN Final     Studies: Ct Abdomen Pelvis W Contrast  06/09/2014   CLINICAL DATA:  23 year old female with right-sided abdominal pain accompanied by nausea and vomiting.  EXAM: CT ABDOMEN AND PELVIS WITH CONTRAST  TECHNIQUE: Multidetector CT imaging of the abdomen and pelvis was performed using the standard protocol following bolus administration of intravenous contrast.  CONTRAST:  25mL OMNIPAQUE IOHEXOL 300 MG/ML SOLN, 100mL OMNIPAQUE IOHEXOL 300 MG/ML SOLN  COMPARISON:  Prior CT scan of the abdomen and pelvis 09/19/2013  FINDINGS: Lower Chest: The lung bases are clear. Visualized cardiac structures are within normal limits for size. No pericardial effusion. Unremarkable visualized distal  thoracic esophagus.  Abdomen: Unremarkable CT appearance of the stomach, duodenum, spleen, adrenal glands and pancreas. Normal hepatic contour and morphology. No discrete hepatic lesion. Gallbladder is unremarkable. No intra or extrahepatic biliary ductal dilatation. Unremarkable appearance of the bilateral kidneys. No focal solid lesion, hydronephrosis or nephrolithiasis. No evidence of obstruction or focal bowel wall thickening. Normal appendix in the right lower quadrant. The terminal ileum is unremarkable.  Pelvis: Complex peripherally enhancing tubular fluid collection centered in the region of the right adnexum most consistent with a hydro or pyosalpinx. Given the relative hyper enhancement of the mucosal lining, pyosalpinx is favored. There is an inseparable 5.2 x 4.7 x 5.9 cm intermediate attenuation cystic collection. This appears to be arising from the ovary. There is a separate small peripherally enhancing fluid collection also within the ovary which may represent a corpus luteum. There is a smaller tubular fluid collection in the left aspect of the anatomic pelvis and a smaller 2.8 x 2.2 cm cystic collection in the left ovary. The uterus itself is unremarkable. No free fluid.  Bones/Soft Tissues: No acute fracture or aggressive appearing lytic or blastic osseous lesion.  Vascular: No significant atherosclerotic vascular disease, aneurysmal dilatation or acute abnormality.  IMPRESSION: 1. CT findings are most consistent with bilateral right worse than left tubo-ovarian abscesses. Consider further evaluation with pelvic ultrasound. 2. The appendix is normal.   Electronically Signed   By: Malachy MoanHeath  McCullough M.D.   On: 06/09/2014 19:21    Scheduled Meds: . enoxaparin (LOVENOX) injection  40 mg Subcutaneous Daily  . piperacillin-tazobactam (ZOSYN)  IV  3.375 g Intravenous 3 times per day  . potassium chloride  40 mEq Oral Once   Continuous Infusions: . sodium chloride 100 mL/hr at 06/10/14 0048      Time spent: 20 minutes    Alaynna Kerwood  [facility] Pager [phone] If 7PM-7AM, please contact night-coverage at [email or web address], password [email or web address] 06/10/2014, 8:42 AM  LOS: 1 day

## 2014-06-11 MED ORDER — DOXYCYCLINE HYCLATE 100 MG PO CAPS: 100.0000 mg | ORAL_CAPSULE | Freq: Two times a day (BID) | ORAL | Status: DC

## 2014-06-11 MED ORDER — OXYCODONE-ACETAMINOPHEN 5-325 MG PO TABS: 1.0000 | ORAL_TABLET | Freq: Four times a day (QID) | ORAL | Status: DC | PRN

## 2014-06-11 MED ORDER — IBUPROFEN 800 MG PO TABS: 800.0000 mg | ORAL_TABLET | Freq: Three times a day (TID) | ORAL | Status: DC | PRN

## 2014-06-11 MED ORDER — METRONIDAZOLE 500 MG PO TABS: 500.0000 mg | ORAL_TABLET | Freq: Two times a day (BID) | ORAL | Status: DC

## 2014-06-11 NOTE — Progress Notes (Signed)
Discharge instructions reviewed with patient.  Patient states understanding of home care, medications, activity, signs/symptoms to report to MD and return MD office visit.  Patients family will assist with her care @ home.  No home equipment needed.  Patient ambulated for discharge in stable condition with staff without incident. 

## 2014-06-11 NOTE — Discharge Instructions (Signed)
Pelvic Inflammatory Disease °Pelvic inflammatory disease (PID) refers to an infection in some or all of the female organs. The infection can be in the uterus, ovaries, fallopian tubes, or the surrounding tissues in the pelvis. PID can cause abdominal or pelvic pain that comes on suddenly (acute pelvic pain). PID is a serious infection because it can lead to lasting (chronic) pelvic pain or the inability to have children (infertile).  °CAUSES  °The infection is often caused by the normal bacteria found in the vaginal tissues. PID may also be caused by an infection that is spread during sexual contact. PID can also occur following:  °· The birth of a baby.   °· A miscarriage.   °· An abortion.   °· Major pelvic surgery.   °· The use of an intrauterine device (IUD).   °· A sexual assault.   °RISK FACTORS °Certain factors can put a person at higher risk for PID, such as: °· Being younger than 25 years. °· Being sexually active at a young age. °· Using nonbarrier contraception. °· Having multiple sexual partners. °· Having sex with someone who has symptoms of a genital infection. °· Using oral contraception. °Other times, certain behaviors can increase the possibility of getting PID, such as: °· Having sex during your period. °· Using a vaginal douche. °· Having an intrauterine device (IUD) in place. °SYMPTOMS  °· Abdominal or pelvic pain.   °· Fever.   °· Chills.   °· Abnormal vaginal discharge. °· Abnormal uterine bleeding.   °· Unusual pain shortly after finishing your period. °DIAGNOSIS  °Your caregiver will choose some of the following methods to make a diagnosis, such as:  °· Performing a physical exam and history. A pelvic exam typically reveals a very tender uterus and surrounding pelvis.   °· Ordering laboratory tests including a pregnancy test, blood tests, and urine test.  °· Ordering cultures of the vagina and cervix to check for a sexually transmitted infection (STI). °· Performing an ultrasound.    °· Performing a laparoscopic procedure to look inside the pelvis.   °TREATMENT  °· Antibiotic medicines may be prescribed and taken by mouth.   °· Sexual partners may be treated when the infection is caused by a sexually transmitted disease (STD).   °· Hospitalization may be needed to give antibiotics intravenously. °· Surgery may be needed, but this is rare. °It may take weeks until you are completely well. If you are diagnosed with PID, you should also be checked for human immunodeficiency virus (HIV).   °HOME CARE INSTRUCTIONS  °· If given, take your antibiotics as directed. Finish the medicine even if you start to feel better.   °· Only take over-the-counter or prescription medicines for pain, discomfort, or fever as directed by your caregiver.   °· Do not have sexual intercourse until treatment is completed or as directed by your caregiver. If PID is confirmed, your recent sexual partner(s) will need treatment.   °· Keep your follow-up appointments. °SEEK MEDICAL CARE IF:  °· You have increased or abnormal vaginal discharge.   °· You need prescription medicine for your pain.   °· You vomit.   °· You cannot take your medicines.   °· Your partner has an STD.   °SEEK IMMEDIATE MEDICAL CARE IF:  °· You have a fever.   °· You have increased abdominal or pelvic pain.   °· You have chills.   °· You have pain when you urinate.   °· You are not better after 72 hours following treatment.   °MAKE SURE YOU:  °· Understand these instructions. °· Will watch your condition. °· Will get help right away if you are not doing well or get worse. °  Document Released: 06/30/2005 Document Revised: 10/25/2012 Document Reviewed: 06/26/2011 °ExitCare® Patient Information ©2015 [employer]. This information is not intended to replace advice given to you by your health care provider. Make sure you discuss any questions you have with your health care provider. ° °

## 2014-06-11 NOTE — Discharge Summary (Signed)
Physician Discharge Summary  Patient ID: Nancy Harper MRN: [id number] DOB/AGE: [birth year] 23 y.o.  Admit date: 06/09/2014 Discharge date: 06/11/2014  Admission Diagnoses:  Discharge Diagnoses:  Principal Problem:   TOA (tubo-ovarian abscess); bilateral Active Problems:   Bacterial vaginosis   Normocytic anemia  Discharged Condition: Stable  Hospital Course: Admitted for bilateral TOA, remained afebrile, was treated with IV antibiotics and pain improved. Discharged on oral antibiotic regimen.  Consults: None  Significant Diagnostic Studies:  Results for orders placed or performed during the hospital encounter of 06/09/14 (from the past 72 hour(s))  Urinalysis, Routine w reflex microscopic     Status: Abnormal   Collection Time: 06/09/14  2:29 PM  Result Value Ref Range   Color, Urine RED (A) YELLOW    Comment: CORRECTED ON 11/27 AT 1453: PREVIOUSLY REPORTED AS YELLOW RED   APPearance CLOUDY (A) CLEAR    Comment: CORRECTED ON 11/27 AT 1453: PREVIOUSLY REPORTED AS CLEAR CLOUDY   Specific Gravity, Urine 1.028 1.005 - 1.030   pH 6.5 5.0 - 8.0   Glucose, UA NEGATIVE NEGATIVE mg/dL   Hgb urine dipstick LARGE (A) NEGATIVE   Bilirubin Urine NEGATIVE NEGATIVE   Ketones, ur 15 (A) NEGATIVE mg/dL   Protein, ur 30 (A) NEGATIVE mg/dL   Urobilinogen, UA 0.2 0.0 - 1.0 mg/dL   Nitrite NEGATIVE NEGATIVE   Leukocytes, UA NEGATIVE NEGATIVE  Pregnancy, urine     Status: None   Collection Time: 06/09/14  2:29 PM  Result Value Ref Range   Preg Test, Ur NEGATIVE NEGATIVE    Comment:        THE SENSITIVITY OF THIS METHODOLOGY IS >20 mIU/mL.   Urine microscopic-add on     Status: Abnormal   Collection Time: 06/09/14  2:29 PM  Result Value Ref Range   Squamous Epithelial / LPF RARE RARE   WBC, UA 3-6 <3 WBC/hpf   RBC / HPF TOO NUMEROUS TO COUNT <3 RBC/hpf   Bacteria, UA FEW (A) RARE  CBC with Differential     Status: Abnormal   Collection Time: 06/09/14  4:27 PM  Result Value Ref Range    WBC 13.5 (H) 4.0 - 10.5 K/uL   RBC 4.08 3.87 - 5.11 MIL/uL   Hemoglobin 11.4 (L) 12.0 - 15.0 g/dL   HCT 34.3 (L) 36.0 - 46.0 %   MCV 84.1 78.0 - 100.0 fL   MCH 27.9 26.0 - 34.0 pg   MCHC 33.2 30.0 - 36.0 g/dL   RDW 13.2 11.5 - 15.5 %   Platelets 243 150 - 400 K/uL   Neutrophils Relative % 86 (H) 43 - 77 %   Neutro Abs 11.6 (H) 1.7 - 7.7 K/uL   Lymphocytes Relative 10 (L) 12 - 46 %   Lymphs Abs 1.3 0.7 - 4.0 K/uL   Monocytes Relative 4 3 - 12 %   Monocytes Absolute 0.6 0.1 - 1.0 K/uL   Eosinophils Relative 0 0 - 5 %   Eosinophils Absolute 0.0 0.0 - 0.7 K/uL   Basophils Relative 0 0 - 1 %   Basophils Absolute 0.0 0.0 - 0.1 K/uL  Comprehensive metabolic panel     Status: Abnormal   Collection Time: 06/09/14  4:27 PM  Result Value Ref Range   Sodium 140 137 - 147 mEq/L   Potassium 3.9 3.7 - 5.3 mEq/L   Chloride 104 96 - 112 mEq/L   CO2 22 19 - 32 mEq/L   Glucose, Bld 102 (H) 70 - 99 mg/dL  BUN 6 6 - 23 mg/dL   Creatinine, Ser 0.50 0.50 - 1.10 mg/dL   Calcium 9.0 8.4 - 10.5 mg/dL   Total Protein 8.1 6.0 - 8.3 g/dL   Albumin 4.0 3.5 - 5.2 g/dL   AST 18 0 - 37 U/L   ALT 14 0 - 35 U/L   Alkaline Phosphatase 90 39 - 117 U/L   Total Bilirubin 0.3 0.3 - 1.2 mg/dL   GFR calc non Af Amer >90 >90 mL/min   GFR calc Af Amer >90 >90 mL/min    Comment: (NOTE) The eGFR has been calculated using the CKD EPI equation. This calculation has not been validated in all clinical situations. eGFR's persistently <90 mL/min signify possible Chronic Kidney Disease.    Anion gap 14 5 - 15  Lipase, blood     Status: None   Collection Time: 06/09/14  4:27 PM  Result Value Ref Range   Lipase 17 11 - 59 U/L  Wet prep, genital     Status: Abnormal   Collection Time: 06/09/14  7:09 PM  Result Value Ref Range   Yeast Wet Prep HPF POC NONE SEEN NONE SEEN   Trich, Wet Prep NONE SEEN NONE SEEN   Clue Cells Wet Prep HPF POC FEW (A) NONE SEEN   WBC, Wet Prep HPF POC FEW (A) NONE SEEN  Basic metabolic  panel     Status: Abnormal   Collection Time: 06/10/14  4:45 AM  Result Value Ref Range   Sodium 139 137 - 147 mEq/L   Potassium 3.3 (L) 3.7 - 5.3 mEq/L   Chloride 105 96 - 112 mEq/L   CO2 20 19 - 32 mEq/L   Glucose, Bld 157 (H) 70 - 99 mg/dL   BUN 3 (L) 6 - 23 mg/dL   Creatinine, Ser 0.51 0.50 - 1.10 mg/dL   Calcium 8.1 (L) 8.4 - 10.5 mg/dL   GFR calc non Af Amer >90 >90 mL/min   GFR calc Af Amer >90 >90 mL/min    Comment: (NOTE) The eGFR has been calculated using the CKD EPI equation. This calculation has not been validated in all clinical situations. eGFR's persistently <90 mL/min signify possible Chronic Kidney Disease.    Anion gap 14 5 - 15  CBC     Status: Abnormal   Collection Time: 06/10/14  4:45 AM  Result Value Ref Range   WBC 11.1 (H) 4.0 - 10.5 K/uL   RBC 3.84 (L) 3.87 - 5.11 MIL/uL   Hemoglobin 10.9 (L) 12.0 - 15.0 g/dL   HCT 32.7 (L) 36.0 - 46.0 %   MCV 85.2 78.0 - 100.0 fL   MCH 28.4 26.0 - 34.0 pg   MCHC 33.3 30.0 - 36.0 g/dL   RDW 13.7 11.5 - 15.5 %   Platelets 228 150 - 400 K/uL    06/10/2014   TRANSABDOMINAL AND TRANSVAGINAL ULTRASOUND OF PELVIS  CLINICAL DATA:  Recently diagnosed bilateral pelvic tubo-ovarian abscesses. Being admitted today. Mild leukocytosis. TECHNIQUE: Both transabdominal and transvaginal ultrasound examinations of the pelvis were performed. Transabdominal technique was performed for global imaging of the pelvis including uterus, ovaries, adnexal regions, and pelvic cul-de-sac. It was necessary to proceed with endovaginal exam following the transabdominal exam to visualize the uterus and ovaries in better detail.  COMPARISON:  Pelvic CT obtained yesterday.  FINDINGS: Uterus  Measurements: 8.1 x 3.7 x 3.5 cm. No fibroids or other mass visualized.  Endometrium  Thickness: 7.4 mm.  No focal abnormality visualized.  Right  ovary  Not visualized separate from a complex cystic area containing fluid/fluid levels. This complex area measures 6.8 x 6.1 x  4.9 cm. There is also a fluid filled tubular structure in the right adnexa.  Left ovary  Measurements: 4.3 x 2.7 x 2.5 cm. Normal appearing ovary with an adjacent fluid-filled tubular structure.  Other findings  No free fluid.  IMPRESSION: 1. Probable large right tubo-ovarian abscess with hydrosalpinx or pyosalpinx. An endometrioma is also a possibility. 2. Left hydrosalpinx or pyosalpinx.   Electronically Signed   By: Enrique Sack M.D.   On: 06/10/2014 18:57   06/09/2014   CT ABDOMEN AND PELVIS WITH CONTRAST CLINICAL DATA:  23 year old female with right-sided abdominal pain accompanied by nausea and vomiting.  TECHNIQUE: Multidetector CT imaging of the abdomen and pelvis was performed using the standard protocol following bolus administration of intravenous contrast.  CONTRAST:  30mL OMNIPAQUE IOHEXOL 300 MG/ML SOLN, 161mL OMNIPAQUE IOHEXOL 300 MG/ML SOLN  COMPARISON:  Prior CT scan of the abdomen and pelvis 09/19/2013  FINDINGS: Lower Chest: The lung bases are clear. Visualized cardiac structures are within normal limits for size. No pericardial effusion. Unremarkable visualized distal thoracic esophagus.  Abdomen: Unremarkable CT appearance of the stomach, duodenum, spleen, adrenal glands and pancreas. Normal hepatic contour and morphology. No discrete hepatic lesion. Gallbladder is unremarkable. No intra or extrahepatic biliary ductal dilatation. Unremarkable appearance of the bilateral kidneys. No focal solid lesion, hydronephrosis or nephrolithiasis. No evidence of obstruction or focal bowel wall thickening. Normal appendix in the right lower quadrant. The terminal ileum is unremarkable.  Pelvis: Complex peripherally enhancing tubular fluid collection centered in the region of the right adnexum most consistent with a hydro or pyosalpinx. Given the relative hyper enhancement of the mucosal lining, pyosalpinx is favored. There is an inseparable 5.2 x 4.7 x 5.9 cm intermediate attenuation cystic collection. This  appears to be arising from the ovary. There is a separate small peripherally enhancing fluid collection also within the ovary which may represent a corpus luteum. There is a smaller tubular fluid collection in the left aspect of the anatomic pelvis and a smaller 2.8 x 2.2 cm cystic collection in the left ovary. The uterus itself is unremarkable. No free fluid.  Bones/Soft Tissues: No acute fracture or aggressive appearing lytic or blastic osseous lesion.  Vascular: No significant atherosclerotic vascular disease, aneurysmal dilatation or acute abnormality.  IMPRESSION: 1. CT findings are most consistent with bilateral right worse than left tubo-ovarian abscesses. Consider further evaluation with pelvic ultrasound. 2. The appendix is normal.   Electronically Signed   By: Jacqulynn Cadet M.D.   On: 06/09/2014 19:21    Discharge Exam: Blood pressure 97/82, pulse 88, temperature 97.6 F (36.4 C), temperature source Oral, resp. rate 17, height $RemoveBe'5\' 3"'[id number]$  (1.6 m), weight 167 lb 8.8 oz (76 kg), last menstrual period 06/08/2014, SpO2 100 %. General appearance: alert and no distress Resp: clear to auscultation bilaterally Cardio: regular rate and rhythm GI: soft, non-tender; bowel sounds normal; no masses,  no organomegaly Pelvic: deferred Extremities: extremities normal, atraumatic, no cyanosis or edema and Homans sign is negative, no sign of DVT  Disposition: 01-Home or Self Care     Medication List    TAKE these medications        doxycycline 100 MG capsule  Commonly known as:  VIBRAMYCIN  Take 1 capsule (100 mg total) by mouth 2 (two) times daily.     ibuprofen 800 MG tablet  Commonly known as:  ADVIL,MOTRIN  Take 1 tablet (800 mg total) by mouth 3 (three) times daily with meals as needed for fever, headache, mild pain or moderate pain.     metroNIDAZOLE 500 MG tablet  Commonly known as:  FLAGYL  Take 1 tablet (500 mg total) by mouth every 12 (twelve) hours.     oxyCODONE-acetaminophen 5-325  MG per tablet  Commonly known as:  PERCOCET/ROXICET  Take 1-2 tablets by mouth every 6 (six) hours as needed.       Follow-up Information    Follow up with [facility] In 3 weeks.   Why:  For follow up. Call clinic/come to [facility] for any concerning issues   Contact information:   [place] [phone]      Signed: Osborne Oman, MD 06/11/2014, 8:28 AM

## 2014-06-12 ENCOUNTER — Encounter: Payer: Self-pay | Admitting: Obstetrics & Gynecology

## 2014-06-12 LAB — GC/CHLAMYDIA PROBE AMP
CT Probe RNA: NEGATIVE
GC PROBE AMP APTIMA: NEGATIVE

## 2014-06-16 ENCOUNTER — Encounter: Payer: Self-pay | Admitting: Obstetrics & Gynecology

## 2014-07-10 ENCOUNTER — Telehealth: Payer: Self-pay | Admitting: General Practice

## 2014-07-10 ENCOUNTER — Encounter: Payer: Self-pay | Admitting: Obstetrics & Gynecology

## 2014-07-10 NOTE — Telephone Encounter (Signed)
Patient no showed for appt today. Called patient and she states she forgot but wants the appt rescheduled. Told patient I will let our front office know and someone will contact her with a new appt. Patient verbalized understanding and had no other questions

## 2014-08-17 ENCOUNTER — Encounter: Payer: Self-pay | Admitting: *Deleted

## 2014-08-17 ENCOUNTER — Telehealth: Payer: Self-pay | Admitting: *Deleted

## 2014-08-17 ENCOUNTER — Encounter: Payer: Self-pay | Admitting: Family Medicine

## 2014-08-17 NOTE — Telephone Encounter (Signed)
[facility] missed an appointment for follow up after admission for TOA. Called Nancy Harper and left a message we are calling to let you know you missed a scheduled appointment . Please call our office to reschedule. Will send letter.

## 2014-09-22 ENCOUNTER — Encounter: Payer: Self-pay | Admitting: Obstetrics & Gynecology

## 2014-12-01 ENCOUNTER — Inpatient Hospital Stay (HOSPITAL_BASED_OUTPATIENT_CLINIC_OR_DEPARTMENT_OTHER)
Admission: EM | Admit: 2014-12-01 | Discharge: 2014-12-02 | DRG: 743 | Disposition: A | Discharge status: Home / Self Care | Payer: Self-pay | Attending: Family Medicine | Admitting: Family Medicine

## 2014-12-01 ENCOUNTER — Encounter (HOSPITAL_BASED_OUTPATIENT_CLINIC_OR_DEPARTMENT_OTHER): Payer: Self-pay | Admitting: Family Medicine

## 2014-12-01 ENCOUNTER — Emergency Department (HOSPITAL_BASED_OUTPATIENT_CLINIC_OR_DEPARTMENT_OTHER): Payer: Self-pay

## 2014-12-01 DIAGNOSIS — F17210 Nicotine dependence, cigarettes, uncomplicated: Secondary | ICD-10-CM | POA: Diagnosis present

## 2014-12-01 DIAGNOSIS — N760 Acute vaginitis: Secondary | ICD-10-CM | POA: Diagnosis present

## 2014-12-01 DIAGNOSIS — R102 Pelvic and perineal pain unspecified side: Secondary | ICD-10-CM | POA: Insufficient documentation

## 2014-12-01 DIAGNOSIS — B9689 Other specified bacterial agents as the cause of diseases classified elsewhere: Secondary | ICD-10-CM | POA: Diagnosis present

## 2014-12-01 DIAGNOSIS — N7093 Salpingitis and oophoritis, unspecified: Principal | ICD-10-CM | POA: Diagnosis present

## 2014-12-01 DIAGNOSIS — N9489 Other specified conditions associated with female genital organs and menstrual cycle: Secondary | ICD-10-CM | POA: Insufficient documentation

## 2014-12-01 DIAGNOSIS — N7011 Chronic salpingitis: Secondary | ICD-10-CM | POA: Diagnosis present

## 2014-12-01 DIAGNOSIS — A499 Bacterial infection, unspecified: Secondary | ICD-10-CM

## 2014-12-01 LAB — COMPREHENSIVE METABOLIC PANEL
ALT: 24 U/L (ref 14–54)
AST: 26 U/L (ref 15–41)
Albumin: 4.1 g/dL (ref 3.5–5.0)
Alkaline Phosphatase: 87 U/L (ref 38–126)
Anion gap: 7 (ref 5–15)
BUN: 8 mg/dL (ref 6–20)
CHLORIDE: 106 mmol/L (ref 101–111)
CO2: 24 mmol/L (ref 22–32)
Calcium: 8.9 mg/dL (ref 8.9–10.3)
Creatinine, Ser: 0.68 mg/dL (ref 0.44–1.00)
Glucose, Bld: 98 mg/dL (ref 65–99)
POTASSIUM: 3.9 mmol/L (ref 3.5–5.1)
SODIUM: 137 mmol/L (ref 135–145)
TOTAL PROTEIN: 8.1 g/dL (ref 6.5–8.1)
Total Bilirubin: 0.2 mg/dL — ABNORMAL LOW (ref 0.3–1.2)

## 2014-12-01 LAB — URINE MICROSCOPIC-ADD ON

## 2014-12-01 LAB — WET PREP, GENITAL
Trich, Wet Prep: NONE SEEN
Yeast Wet Prep HPF POC: NONE SEEN

## 2014-12-01 LAB — CBC WITH DIFFERENTIAL/PLATELET
Basophils Absolute: 0.1 10*3/uL (ref 0.0–0.1)
Basophils Relative: 0 % (ref 0–1)
EOS ABS: 0.3 10*3/uL (ref 0.0–0.7)
EOS PCT: 2 % (ref 0–5)
HEMATOCRIT: 34.1 % — AB (ref 36.0–46.0)
HEMOGLOBIN: 11.2 g/dL — AB (ref 12.0–15.0)
LYMPHS ABS: 4.9 10*3/uL — AB (ref 0.7–4.0)
Lymphocytes Relative: 35 % (ref 12–46)
MCH: 27.5 pg (ref 26.0–34.0)
MCHC: 32.8 g/dL (ref 30.0–36.0)
MCV: 83.6 fL (ref 78.0–100.0)
MONOS PCT: 7 % (ref 3–12)
Monocytes Absolute: 0.9 10*3/uL (ref 0.1–1.0)
Neutro Abs: 7.8 10*3/uL — ABNORMAL HIGH (ref 1.7–7.7)
Neutrophils Relative %: 56 % (ref 43–77)
Platelets: 306 10*3/uL (ref 150–400)
RBC: 4.08 MIL/uL (ref 3.87–5.11)
RDW: 13.5 % (ref 11.5–15.5)
WBC: 14 10*3/uL — ABNORMAL HIGH (ref 4.0–10.5)

## 2014-12-01 LAB — URINALYSIS, ROUTINE W REFLEX MICROSCOPIC
Bilirubin Urine: NEGATIVE
GLUCOSE, UA: NEGATIVE mg/dL
KETONES UR: NEGATIVE mg/dL
Nitrite: NEGATIVE
PROTEIN: NEGATIVE mg/dL
Specific Gravity, Urine: 1.023 (ref 1.005–1.030)
Urobilinogen, UA: 0.2 mg/dL (ref 0.0–1.0)
pH: 7 (ref 5.0–8.0)

## 2014-12-01 LAB — PREGNANCY, URINE: PREG TEST UR: NEGATIVE

## 2014-12-01 MED ORDER — DEXTROSE 5 % IV SOLN
100.0000 mg | Freq: Two times a day (BID) | INTRAVENOUS | Status: DC
  Administered 2014-12-02 (×2): 100 mg via INTRAVENOUS
  Filled 2014-12-01 (×2): qty 100

## 2014-12-01 MED ORDER — DOXYCYCLINE HYCLATE 100 MG IV SOLR
INTRAVENOUS | Status: AC
  Administered 2014-12-01: 100 mg
  Filled 2014-12-01: qty 100

## 2014-12-01 MED ORDER — ONDANSETRON HCL 4 MG/2ML IJ SOLN
4.0000 mg | Freq: Once | INTRAMUSCULAR | Status: AC
  Administered 2014-12-01: 4 mg via INTRAVENOUS
  Filled 2014-12-01: qty 2

## 2014-12-01 MED ORDER — MORPHINE SULFATE 4 MG/ML IJ SOLN
4.0000 mg | Freq: Once | INTRAMUSCULAR | Status: AC
  Administered 2014-12-01: 4 mg via INTRAVENOUS
  Filled 2014-12-01: qty 1

## 2014-12-01 MED ORDER — DOXYCYCLINE HYCLATE 100 MG IV SOLR: 100.0000 mg | Freq: Once | INTRAVENOUS | Status: DC

## 2014-12-01 MED ORDER — IBUPROFEN 600 MG PO TABS: 600.0000 mg | ORAL_TABLET | Freq: Four times a day (QID) | ORAL | Status: DC | PRN

## 2014-12-01 MED ORDER — METRONIDAZOLE 500 MG PO TABS
2000.0000 mg | ORAL_TABLET | Freq: Once | ORAL | Status: AC
  Administered 2014-12-01: 2000 mg via ORAL
  Filled 2014-12-01: qty 4

## 2014-12-01 MED ORDER — METRONIDAZOLE IN NACL 5-0.79 MG/ML-% IV SOLN: 500.0000 mg | Freq: Once | INTRAVENOUS | Status: DC

## 2014-12-01 MED ORDER — PIPERACILLIN-TAZOBACTAM 3.375 G IVPB
3.3750 g | Freq: Once | INTRAVENOUS | Status: DC
  Filled 2014-12-01: qty 50

## 2014-12-01 MED ORDER — DEXTROSE 5 % IV SOLN
2.0000 g | Freq: Two times a day (BID) | INTRAVENOUS | Status: DC
  Administered 2014-12-01 – 2014-12-02 (×2): 2 g via INTRAVENOUS
  Filled 2014-12-01 (×3): qty 2

## 2014-12-01 MED ORDER — OXYCODONE-ACETAMINOPHEN 5-325 MG PO TABS: 1.0000 | ORAL_TABLET | ORAL | Status: DC | PRN

## 2014-12-01 MED ORDER — ONDANSETRON HCL 4 MG PO TABS
4.0000 mg | ORAL_TABLET | Freq: Four times a day (QID) | ORAL | Status: DC | PRN
Start: 2014-12-01 — End: 2014-12-02

## 2014-12-01 MED ORDER — DEXTROSE 5 % IV SOLN
2.0000 g | Freq: Two times a day (BID) | INTRAVENOUS | Status: DC
  Filled 2014-12-01: qty 2

## 2014-12-01 MED ORDER — PRENATAL MULTIVITAMIN CH: 1.0000 | ORAL_TABLET | Freq: Every day | ORAL | Status: DC

## 2014-12-01 MED ORDER — ONDANSETRON HCL 4 MG/2ML IJ SOLN: 4.0000 mg | Freq: Four times a day (QID) | INTRAMUSCULAR | Status: DC | PRN

## 2014-12-01 MED ORDER — SODIUM CHLORIDE 0.9 % IV SOLN
INTRAVENOUS | Status: AC
  Administered 2014-12-01: 21:00:00 via INTRAVENOUS

## 2014-12-01 NOTE — ED Notes (Signed)
Pt requesting pain medication 8/10

## 2014-12-01 NOTE — ED Notes (Signed)
Via [facility]-spoke with Doug 

## 2014-12-01 NOTE — ED Notes (Signed)
[facility] transporting the [facility]

## 2014-12-01 NOTE — H&P (Signed)
Faculty [facility] History and Physical  Verdean Murin ZOX:[id number] DOB: 1991-03-20 DOA: 12/01/2014  Referring physician: Dr Manus Gunning, ED physician PCP: Norton Blizzard, MD   Chief Complaint: Abdominal pain  HPI: Nancy Harper is a 23 y.o. G0P0 female  Who has a history of bilateral TOA in 05/2014.  Per the records, she was admitted for two days and then discharged on oral antibiotics.  She did not have follow up after that, despite several phone calls. She presents with 6 day history of intermittent but worsening abdominal pain. The worst pain that she had was last night which was quite intense. She did have nausea, vomiting, and chills but denies fever. Per the patient this resolved and she currently has fairly minimal pain today. She does complain of some mild nausea today. The left-sided, nonradiating pelvic pain was worse with movement and better with rest. She took a Percocet, which was left over from a previous prescription, which helped minimally. She does have a history of Chlamydia several years ago.  She presented to [facility] ED for evaluation. This patient was discussed with the ED physician, including pertinent vitals, physical exam findings, labs, and imaging.  We also discussed care given by the ED provider. The ER doctor described to me that she had continued abdominal pain that she was currently nauseated and was not taking oral food or liquid at this time.  Review of Systems:   Pt denies any fever, chills, vomiting, chest pain, shortness of breath, pelvic pain, dysuria, vaginal discharge.  Review of systems are otherwise negative  Past Medical History  Diagnosis Date  . Pelvic abscess in female 11/2013    Admitted at [facility], underwent IR drainage   Past Surgical History  Procedure Laterality Date  . Vaginal septum resection  2003    Dr. April Manson, [facility]   Social History:  reports that she has been smoking Cigarettes.  She does not have any  smokeless tobacco history on file. She reports that she drinks about 0.6 oz of alcohol per week. She reports that she does not use illicit drugs. Patient lives at home & is able to participate in activities of daily living  No Known Allergies  No family history on file. patient uncertain of family history   Prior to Admission medications   Medication Sig Start Date End Date Taking? Authorizing Provider  ibuprofen (ADVIL,MOTRIN) 800 MG tablet Take 1 tablet (800 mg total) by mouth 3 (three) times daily with meals as needed for fever, headache, mild pain or moderate pain. 06/11/14   Tereso Newcomer, MD    Physical Exam: BP 114/44 mmHg  Pulse 67  Temp(Src) 98.4 F (36.9 C) (Oral)  Resp 18  Ht  (1.626 m)  Wt 165 lb (74.844 kg)  BMI 28.31 kg/m2  SpO2 100%  LMP 11/13/2014  General: Young black female. Awake and alert and oriented x3. No acute cardiopulmonary distress.  Eyes: Pupils equal, round, reactive to light. Extraocular muscles are intact. Sclerae anicteric and noninjected.  ENT: Moist mucosal membranes. No mucosal lesions.  Neck: Neck supple without lymphadenopathy. No carotid bruits. No masses palpated.  Cardiovascular: Regular rate with normal S1-S2 sounds. No murmurs, rubs, gallops auscultated. No JVD.  Respiratory: Good respiratory effort with no wheezes, rales, rhonchi. Lungs clear to auscultation bilaterally.  Abdomen: Soft, mild right-sided pelvic discomfort, nondistended. No rebound tenderness. Active bowel sounds. No masses or hepatosplenomegaly  Skin: Dry, warm to touch. 2+ dorsalis pedis and radial pulses. Musculoskeletal: No calf  or leg pain. All major joints not erythematous nontender.  Psychiatric: Intact judgment and insight.  Neurologic: No focal neurological deficits. Cranial nerves II through XII are grossly intact.           Labs on Admission:  Basic Metabolic Panel:  Recent Labs Lab 12/01/14 1520  NA 137  K 3.9  CL 106  CO2 24  GLUCOSE 98   BUN 8  CREATININE 0.68  CALCIUM 8.9   Liver Function Tests:  Recent Labs Lab 12/01/14 1520  AST 26  ALT 24  ALKPHOS 87  BILITOT 0.2*  PROT 8.1  ALBUMIN 4.1   No results for input(s): LIPASE, AMYLASE in the last 168 hours. No results for input(s): AMMONIA in the last 168 hours. CBC:  Recent Labs Lab 12/01/14 1520  WBC 14.0*  NEUTROABS 7.8*  HGB 11.2*  HCT 34.1*  MCV 83.6  PLT 306   Cardiac Enzymes: No results for input(s): CKTOTAL, CKMB, CKMBINDEX, TROPONINI in the last 168 hours.  BNP (last 3 results) No results for input(s): BNP in the last 8760 hours.  ProBNP (last 3 results) No results for input(s): PROBNP in the last 8760 hours.  CBG: No results for input(s): GLUCAP in the last 168 hours.  Radiological Exams on Admission: [place] Transvaginal Non-ob  12/01/2014   CLINICAL DATA:  Pelvic pain on RIGHT for 5 days worse last night, nausea and vomiting, history of PID and BILATERAL tubo-ovarian abscess 2015, on depth of shot, smoker  EXAM: TRANSABDOMINAL AND TRANSVAGINAL ULTRASOUND OF PELVIS  DOPPLER ULTRASOUND OF OVARIES  TECHNIQUE: Both transabdominal and transvaginal ultrasound examinations of the pelvis were performed. Transabdominal technique was performed for global imaging of the pelvis including uterus, ovaries, adnexal regions, and pelvic cul-de-sac.  It was necessary to proceed with endovaginal exam following the transabdominal exam to visualize the endometrium and ovaries. Color and duplex Doppler ultrasound was utilized to evaluate blood flow to the ovaries.  COMPARISON:  Ultrasound 06/10/2014, CT abdomen and pelvis 06/09/2014  FINDINGS: Uterus  Measurements: 8.1 x 3.7 x 3.8 cm. Normal morphology without mass.  Endometrium  Thickness: 10 mm thick, normal. No endometrial fluid or focal abnormality.  Right ovary  Complex cystic RIGHT adnexal mass 10.2 x 8.8 x 10.0 cm in overall size with the dominant component a large multi loculated cystic collection containing  diffuse internal echogenicity within the loculations. Findings are highly suggestive of recurrent RIGHT tubo-ovarian abscess. Blood flow present within what appears to be potentially normal residual ovarian tissue, as well as within the walls of the large multiloculated cystic collection.  Left ovary  Measurements: 5.9 x 2.5 x 3.1 cm probable small collapsed cyst 2.1 x 1.0 x 1.3 cm. Additional small hyperechoic nodule 10 mm diameter, nonspecific, could potentially represent a tiny dermoid. Blood flow present within LEFT ovary on color Doppler imaging.  Pulsed Doppler evaluation of both ovaries demonstrates normal low-resistance arterial and venous waveforms.  Other findings  No free pelvic fluid.  IMPRESSION: Abnormal appearance of the RIGHT adnexa where a 10.2 x 8.8 x 10.0 cm diameter complex cystic mass is identified likely representing a combination of the RIGHT ovary and recurrent large tubo-ovarian abscess.  No definite evidence of ovarian torsion.  Uterus and LEFT ovary otherwise unremarkable.   Electronically Signed   By: Ulyses SouthwardMark  Boles M.D.   On: 12/01/2014 17:03   [place] Pelvis Complete  12/01/2014   CLINICAL DATA:  Pelvic pain on RIGHT for 5 days worse last night, nausea and vomiting, history of PID  and BILATERAL tubo-ovarian abscess 2015, on depth of shot, smoker  EXAM: TRANSABDOMINAL AND TRANSVAGINAL ULTRASOUND OF PELVIS  DOPPLER ULTRASOUND OF OVARIES  TECHNIQUE: Both transabdominal and transvaginal ultrasound examinations of the pelvis were performed. Transabdominal technique was performed for global imaging of the pelvis including uterus, ovaries, adnexal regions, and pelvic cul-de-sac.  It was necessary to proceed with endovaginal exam following the transabdominal exam to visualize the endometrium and ovaries. Color and duplex Doppler ultrasound was utilized to evaluate blood flow to the ovaries.  COMPARISON:  Ultrasound 06/10/2014, CT abdomen and pelvis 06/09/2014  FINDINGS: Uterus  Measurements: 8.1 x  3.7 x 3.8 cm. Normal morphology without mass.  Endometrium  Thickness: 10 mm thick, normal. No endometrial fluid or focal abnormality.  Right ovary  Complex cystic RIGHT adnexal mass 10.2 x 8.8 x 10.0 cm in overall size with the dominant component a large multi loculated cystic collection containing diffuse internal echogenicity within the loculations. Findings are highly suggestive of recurrent RIGHT tubo-ovarian abscess. Blood flow present within what appears to be potentially normal residual ovarian tissue, as well as within the walls of the large multiloculated cystic collection.  Left ovary  Measurements: 5.9 x 2.5 x 3.1 cm probable small collapsed cyst 2.1 x 1.0 x 1.3 cm. Additional small hyperechoic nodule 10 mm diameter, nonspecific, could potentially represent a tiny dermoid. Blood flow present within LEFT ovary on color Doppler imaging.  Pulsed Doppler evaluation of both ovaries demonstrates normal low-resistance arterial and venous waveforms.  Other findings  No free pelvic fluid.  IMPRESSION: Abnormal appearance of the RIGHT adnexa where a 10.2 x 8.8 x 10.0 cm diameter complex cystic mass is identified likely representing a combination of the RIGHT ovary and recurrent large tubo-ovarian abscess.  No definite evidence of ovarian torsion.  Uterus and LEFT ovary otherwise unremarkable.   Electronically Signed   By: Ulyses Southward M.D.   On: 12/01/2014 17:03   [place] Art/ven Flow Abd Pelv Doppler  12/01/2014   CLINICAL DATA:  Pelvic pain on RIGHT for 5 days worse last night, nausea and vomiting, history of PID and BILATERAL tubo-ovarian abscess 2015, on depth of shot, smoker  EXAM: TRANSABDOMINAL AND TRANSVAGINAL ULTRASOUND OF PELVIS  DOPPLER ULTRASOUND OF OVARIES  TECHNIQUE: Both transabdominal and transvaginal ultrasound examinations of the pelvis were performed. Transabdominal technique was performed for global imaging of the pelvis including uterus, ovaries, adnexal regions, and pelvic cul-de-sac.  It was  necessary to proceed with endovaginal exam following the transabdominal exam to visualize the endometrium and ovaries. Color and duplex Doppler ultrasound was utilized to evaluate blood flow to the ovaries.  COMPARISON:  Ultrasound 06/10/2014, CT abdomen and pelvis 06/09/2014  FINDINGS: Uterus  Measurements: 8.1 x 3.7 x 3.8 cm. Normal morphology without mass.  Endometrium  Thickness: 10 mm thick, normal. No endometrial fluid or focal abnormality.  Right ovary  Complex cystic RIGHT adnexal mass 10.2 x 8.8 x 10.0 cm in overall size with the dominant component a large multi loculated cystic collection containing diffuse internal echogenicity within the loculations. Findings are highly suggestive of recurrent RIGHT tubo-ovarian abscess. Blood flow present within what appears to be potentially normal residual ovarian tissue, as well as within the walls of the large multiloculated cystic collection.  Left ovary  Measurements: 5.9 x 2.5 x 3.1 cm probable small collapsed cyst 2.1 x 1.0 x 1.3 cm. Additional small hyperechoic nodule 10 mm diameter, nonspecific, could potentially represent a tiny dermoid. Blood flow present within LEFT ovary on color Doppler imaging.  Pulsed Doppler evaluation of both ovaries demonstrates normal low-resistance arterial and venous waveforms.  Other findings  No free pelvic fluid.  IMPRESSION: Abnormal appearance of the RIGHT adnexa where a 10.2 x 8.8 x 10.0 cm diameter complex cystic mass is identified likely representing a combination of the RIGHT ovary and recurrent large tubo-ovarian abscess.  No definite evidence of ovarian torsion.  Uterus and LEFT ovary otherwise unremarkable.   Electronically Signed   By: Ulyses SouthwardMark  Boles M.D.   On: 12/01/2014 17:03    Assessment/Plan Present on Admission:  . Tubo-ovarian abscess  #1 tubo-ovarian abscess #2 bacterial vaginosis  Admits as the patient has a very large tubo-ovarian abscess with a white count. Cefotetan and doxycycline for  antibiotics Repeat CBC in the morning Patient will need IV antibiotics until pain improves and improved white count. May consider drainage by interventional radiology Patient status post Flagyl 2 g.  DVT prophylaxis: Ambulation  Consultants: None  Code Status: Full code  Family Communication: None   Disposition Plan: Home following improvement   Levie HeritageJacob J Printice Hellmer, DO Faculty Practice Attending [phone]

## 2014-12-01 NOTE — ED Provider Notes (Signed)
CSN: [id number]     Arrival date & time 12/01/14  1441 History   First MD Initiated Contact with Patient 12/01/14 1501     Chief Complaint  Patient presents with  . Abdominal Pain     (Consider location/radiation/quality/duration/timing/severity/associated sxs/prior Treatment) HPI Comments: Patient presents with a 5 day history of lower abdominal pain that became worse yesterday. She describes sharp stabbing pain in her right lower quadrant and suprapubic area that lasts about 2 minutes at a time and comes and goes every few minutes. Associated with one episode of vomiting last night. Denies fever but has had chills. No change in bowel habits. No dysuria hematuria. No vaginal bleeding or discharge. Patient has a history of tubo-ovarian abscesses and was hospitalized in November. She did not complete the antibiotic course. She reports this pain feels different it is as it is more sharp. She denies any chest pain, shortness of breath or back pain.  The history is provided by the patient.    Past Medical History  Diagnosis Date  . Pelvic abscess in female 11/2013    Admitted at [facility], underwent IR drainage   Past Surgical History  Procedure Laterality Date  . Vaginal septum resection  2003    Dr. April Manson, [facility]   No family history on file. History  Substance Use Topics  . Smoking status: Current Every Day Smoker    Types: Cigarettes  . Smokeless tobacco: Not on file  . Alcohol Use: 0.6 oz/week    1 Glasses of wine per week   OB History    Gravida Para Term Preterm AB TAB SAB Ectopic Multiple Living       Review of Systems  Constitutional: Positive for chills, activity change and appetite change. Negative for fever.  HENT: Negative for congestion and rhinorrhea.   Respiratory: Negative for cough, chest tightness and shortness of breath.   Cardiovascular: Negative for chest pain.  Gastrointestinal: Positive for nausea, vomiting and  abdominal pain.  Genitourinary: Positive for dyspareunia. Negative for dysuria, hematuria, vaginal bleeding and vaginal discharge.  Musculoskeletal: Negative for myalgias and arthralgias.  Skin: Negative for rash.  Neurological: Negative for dizziness, light-headedness and headaches.  A complete 10 system review of systems was obtained and all systems are negative except as noted in the HPI and PMH.      Allergies  Review of patient's allergies indicates no known allergies.  Home Medications   Prior to Admission medications   Medication Sig Start Date End Date Taking? Authorizing Provider  doxycycline (VIBRAMYCIN) 100 MG capsule Take 1 capsule (100 mg total) by mouth 2 (two) times daily. 06/11/14   Tereso Newcomer, MD  ibuprofen (ADVIL,MOTRIN) 800 MG tablet Take 1 tablet (800 mg total) by mouth 3 (three) times daily with meals as needed for fever, headache, mild pain or moderate pain. 06/11/14   Tereso Newcomer, MD  metroNIDAZOLE (FLAGYL) 500 MG tablet Take 1 tablet (500 mg total) by mouth every 12 (twelve) hours. 06/11/14   Tereso Newcomer, MD  oxyCODONE-acetaminophen (PERCOCET/ROXICET) 5-325 MG per tablet Take 1-2 tablets by mouth every 6 (six) hours as needed. 06/11/14   Tereso Newcomer, MD   BP 111/69 mmHg  Pulse 77  Temp(Src) 98.6 F (37 C) (Oral)  Resp 18  Ht  (1.626 m)  Wt 165 lb (74.844 kg)  BMI 28.31 kg/m2  SpO2 100%  LMP 11/13/2014 Physical Exam  Constitutional: She is  oriented to person, place, and time. She appears well-developed and well-nourished. No distress.  HENT:  Head: Normocephalic and atraumatic.  Mouth/Throat: Oropharynx is clear and moist. No oropharyngeal exudate.  Eyes: Conjunctivae and EOM are normal. Pupils are equal, round, and reactive to light.  Neck: Normal range of motion. Neck supple.  No meningismus.  Cardiovascular: Normal rate, regular rhythm, normal heart sounds and intact distal pulses.   No murmur heard. Pulmonary/Chest: Effort  normal and breath sounds normal. No respiratory distress.  Abdominal: Soft. There is tenderness. There is no rebound and no guarding.  TTP RLQ and suprapubic  Genitourinary:  Normal external genitalia.  White vaginal discharge. No CMT.  TTP uterus and R adnexa. NO L adnexal pain chaperone  Musculoskeletal: Normal range of motion. She exhibits no edema or tenderness.  No CVAT  Neurological: She is alert and oriented to person, place, and time. No cranial nerve deficit. She exhibits normal muscle tone. Coordination normal.  No ataxia on finger to nose bilaterally. No pronator drift. 5/5 strength throughout. CN 2-12 intact. Negative Romberg. Equal grip strength. Sensation intact. Gait is normal.   Skin: Skin is warm.  Psychiatric: She has a normal mood and affect. Her behavior is normal.  Nursing note and vitals reviewed.   ED Course  Procedures (including critical care time) Labs Review Labs Reviewed  WET PREP, GENITAL - Abnormal; Notable for the following:    Clue Cells Wet Prep HPF POC MODERATE (*)    WBC, Wet Prep HPF POC FEW (*)    All other components within normal limits  URINALYSIS, ROUTINE W REFLEX MICROSCOPIC - Abnormal; Notable for the following:    APPearance CLOUDY (*)    Hgb urine dipstick TRACE (*)    Leukocytes, UA MODERATE (*)    All other components within normal limits  CBC WITH DIFFERENTIAL/PLATELET - Abnormal; Notable for the following:    WBC 14.0 (*)    Hemoglobin 11.2 (*)    HCT 34.1 (*)    Neutro Abs 7.8 (*)    Lymphs Abs 4.9 (*)    All other components within normal limits  COMPREHENSIVE METABOLIC PANEL - Abnormal; Notable for the following:    Total Bilirubin 0.2 (*)    All other components within normal limits  URINE MICROSCOPIC-ADD ON - Abnormal; Notable for the following:    Squamous Epithelial / LPF FEW (*)    Bacteria, UA FEW (*)    All other components within normal limits  PREGNANCY, URINE  GC/CHLAMYDIA PROBE AMP ([place])    Imaging  Review [place] Transvaginal Non-ob  12/01/2014   CLINICAL DATA:  Pelvic pain on RIGHT for 5 days worse last night, nausea and vomiting, history of PID and BILATERAL tubo-ovarian abscess 2015, on depth of shot, smoker  EXAM: TRANSABDOMINAL AND TRANSVAGINAL ULTRASOUND OF PELVIS  DOPPLER ULTRASOUND OF OVARIES  TECHNIQUE: Both transabdominal and transvaginal ultrasound examinations of the pelvis were performed. Transabdominal technique was performed for global imaging of the pelvis including uterus, ovaries, adnexal regions, and pelvic cul-de-sac.  It was necessary to proceed with endovaginal exam following the transabdominal exam to visualize the endometrium and ovaries. Color and duplex Doppler ultrasound was utilized to evaluate blood flow to the ovaries.  COMPARISON:  Ultrasound 06/10/2014, CT abdomen and pelvis 06/09/2014  FINDINGS: Uterus  Measurements: 8.1 x 3.7 x 3.8 cm. Normal morphology without mass.  Endometrium  Thickness: 10 mm thick, normal. No endometrial fluid or focal abnormality.  Right ovary  Complex cystic RIGHT adnexal mass  10.2 x 8.8 x 10.0 cm in overall size with the dominant component a large multi loculated cystic collection containing diffuse internal echogenicity within the loculations. Findings are highly suggestive of recurrent RIGHT tubo-ovarian abscess. Blood flow present within what appears to be potentially normal residual ovarian tissue, as well as within the walls of the large multiloculated cystic collection.  Left ovary  Measurements: 5.9 x 2.5 x 3.1 cm probable small collapsed cyst 2.1 x 1.0 x 1.3 cm. Additional small hyperechoic nodule 10 mm diameter, nonspecific, could potentially represent a tiny dermoid. Blood flow present within LEFT ovary on color Doppler imaging.  Pulsed Doppler evaluation of both ovaries demonstrates normal low-resistance arterial and venous waveforms.  Other findings  No free pelvic fluid.  IMPRESSION: Abnormal appearance of the RIGHT adnexa where a 10.2 x 8.8  x 10.0 cm diameter complex cystic mass is identified likely representing a combination of the RIGHT ovary and recurrent large tubo-ovarian abscess.  No definite evidence of ovarian torsion.  Uterus and LEFT ovary otherwise unremarkable.   Electronically Signed   By: Ulyses Southward M.D.   On: 12/01/2014 17:03   US Pelvis Complete  12/01/2014   CLINICAL DATA:  Pelvic pain on RIGHT for 5 days worse last night, nausea and vomiting, history of PID and BILATERAL tubo-ovarian abscess 2015, on depth of shot, smoker  EXAM: TRANSABDOMINAL AND TRANSVAGINAL ULTRASOUND OF PELVIS  DOPPLER ULTRASOUND OF OVARIES  TECHNIQUE: Both transabdominal and transvaginal ultrasound examinations of the pelvis were performed. Transabdominal technique was performed for global imaging of the pelvis including uterus, ovaries, adnexal regions, and pelvic cul-de-sac.  It was necessary to proceed with endovaginal exam following the transabdominal exam to visualize the endometrium and ovaries. Color and duplex Doppler ultrasound was utilized to evaluate blood flow to the ovaries.  COMPARISON:  Ultrasound 06/10/2014, CT abdomen and pelvis 06/09/2014  FINDINGS: Uterus  Measurements: 8.1 x 3.7 x 3.8 cm. Normal morphology without mass.  Endometrium  Thickness: 10 mm thick, normal. No endometrial fluid or focal abnormality.  Right ovary  Complex cystic RIGHT adnexal mass 10.2 x 8.8 x 10.0 cm in overall size with the dominant component a large multi loculated cystic collection containing diffuse internal echogenicity within the loculations. Findings are highly suggestive of recurrent RIGHT tubo-ovarian abscess. Blood flow present within what appears to be potentially normal residual ovarian tissue, as well as within the walls of the large multiloculated cystic collection.  Left ovary  Measurements: 5.9 x 2.5 x 3.1 cm probable small collapsed cyst 2.1 x 1.0 x 1.3 cm. Additional small hyperechoic nodule 10 mm diameter, nonspecific, could potentially represent  a tiny dermoid. Blood flow present within LEFT ovary on color Doppler imaging.  Pulsed Doppler evaluation of both ovaries demonstrates normal low-resistance arterial and venous waveforms.  Other findings  No free pelvic fluid.  IMPRESSION: Abnormal appearance of the RIGHT adnexa where a 10.2 x 8.8 x 10.0 cm diameter complex cystic mass is identified likely representing a combination of the RIGHT ovary and recurrent large tubo-ovarian abscess.  No definite evidence of ovarian torsion.  Uterus and LEFT ovary otherwise unremarkable.   Electronically Signed   By: Ulyses Southward M.D.   On: 12/01/2014 17:03   [place] Art/ven Flow Abd Pelv Doppler  12/01/2014   CLINICAL DATA:  Pelvic pain on RIGHT for 5 days worse last night, nausea and vomiting, history of PID and BILATERAL tubo-ovarian abscess 2015, on depth of shot, smoker  EXAM: TRANSABDOMINAL AND TRANSVAGINAL ULTRASOUND OF PELVIS  DOPPLER  ULTRASOUND OF OVARIES  TECHNIQUE: Both transabdominal and transvaginal ultrasound examinations of the pelvis were performed. Transabdominal technique was performed for global imaging of the pelvis including uterus, ovaries, adnexal regions, and pelvic cul-de-sac.  It was necessary to proceed with endovaginal exam following the transabdominal exam to visualize the endometrium and ovaries. Color and duplex Doppler ultrasound was utilized to evaluate blood flow to the ovaries.  COMPARISON:  Ultrasound 06/10/2014, CT abdomen and pelvis 06/09/2014  FINDINGS: Uterus  Measurements: 8.1 x 3.7 x 3.8 cm. Normal morphology without mass.  Endometrium  Thickness: 10 mm thick, normal. No endometrial fluid or focal abnormality.  Right ovary  Complex cystic RIGHT adnexal mass 10.2 x 8.8 x 10.0 cm in overall size with the dominant component a large multi loculated cystic collection containing diffuse internal echogenicity within the loculations. Findings are highly suggestive of recurrent RIGHT tubo-ovarian abscess. Blood flow present within what appears  to be potentially normal residual ovarian tissue, as well as within the walls of the large multiloculated cystic collection.  Left ovary  Measurements: 5.9 x 2.5 x 3.1 cm probable small collapsed cyst 2.1 x 1.0 x 1.3 cm. Additional small hyperechoic nodule 10 mm diameter, nonspecific, could potentially represent a tiny dermoid. Blood flow present within LEFT ovary on color Doppler imaging.  Pulsed Doppler evaluation of both ovaries demonstrates normal low-resistance arterial and venous waveforms.  Other findings  No free pelvic fluid.  IMPRESSION: Abnormal appearance of the RIGHT adnexa where a 10.2 x 8.8 x 10.0 cm diameter complex cystic mass is identified likely representing a combination of the RIGHT ovary and recurrent large tubo-ovarian abscess.  No definite evidence of ovarian torsion.  Uterus and LEFT ovary otherwise unremarkable.   Electronically Signed   By: Ulyses SouthwardMark  Boles M.D.   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  No fever.  Vitals stable.  Lower abdominal tenderness with peritoneal signs.   Leukocytosis of 14. Clue cells on wet prep.  UA with leukocytes and WBCs.  Ultrasound shows enlarged right adnexal mass including tubo-ovarian abscess. No evidence of torsion.  With adnexal process, doubt appendicitis.  Discussed with gynecology Dr. Adrian BlackwaterStinson. He agrees patient needs admission for IV antibiotics. He recommends doxycycline and cefotetan. Also give by mouth Flagyl for bacterial vaginosis. [facility]. She remained stable in the ED.    Glynn OctaveStephen Saharra Santo, MD 12/01/14 [phone]

## 2014-12-01 NOTE — ED Notes (Signed)
Pt c/o right lower abdominal pain x 5 days with a history of pelvic abscess. She states she did not complete her antibiotics from her last visit and hospital admission. Denies fever, nausea. One episode of emesis this past week and chills.

## 2014-12-02 ENCOUNTER — Inpatient Hospital Stay (HOSPITAL_COMMUNITY): Payer: Self-pay

## 2014-12-02 ENCOUNTER — Encounter (HOSPITAL_COMMUNITY): Payer: Self-pay | Admitting: Radiology

## 2014-12-02 ENCOUNTER — Ambulatory Visit (HOSPITAL_COMMUNITY)
Admission: RE | Admit: 2014-12-02 | Discharge: 2014-12-02 | Disposition: A | Discharge status: Home / Self Care | Payer: Self-pay | Source: Ambulatory Visit | Attending: Diagnostic Radiology | Admitting: Diagnostic Radiology

## 2014-12-02 DIAGNOSIS — N9489 Other specified conditions associated with female genital organs and menstrual cycle: Secondary | ICD-10-CM | POA: Insufficient documentation

## 2014-12-02 DIAGNOSIS — N949 Unspecified condition associated with female genital organs and menstrual cycle: Secondary | ICD-10-CM

## 2014-12-02 DIAGNOSIS — R102 Pelvic and perineal pain: Secondary | ICD-10-CM | POA: Insufficient documentation

## 2014-12-02 DIAGNOSIS — N7093 Salpingitis and oophoritis, unspecified: Principal | ICD-10-CM | POA: Insufficient documentation

## 2014-12-02 LAB — CBC
HCT: 30.8 % — ABNORMAL LOW (ref 36.0–46.0)
HEMOGLOBIN: 10.2 g/dL — AB (ref 12.0–15.0)
MCH: 27.3 pg (ref 26.0–34.0)
MCHC: 33.1 g/dL (ref 30.0–36.0)
MCV: 82.6 fL (ref 78.0–100.0)
Platelets: 248 10*3/uL (ref 150–400)
RBC: 3.73 MIL/uL — AB (ref 3.87–5.11)
RDW: 13.8 % (ref 11.5–15.5)
WBC: 8.6 10*3/uL (ref 4.0–10.5)

## 2014-12-02 MED ORDER — IOHEXOL 300 MG/ML  SOLN
100.0000 mL | Freq: Once | INTRAMUSCULAR | Status: AC | PRN
Start: 2014-12-02 — End: 2014-12-02
  Administered 2014-12-02: 100 mL via INTRAVENOUS

## 2014-12-02 MED ORDER — IOHEXOL 300 MG/ML  SOLN
50.0000 mL | Freq: Once | INTRAMUSCULAR | Status: AC | PRN
  Administered 2014-12-02: 50 mL via ORAL

## 2014-12-02 MED ORDER — SODIUM CHLORIDE 0.9 % IV SOLN
INTRAVENOUS | Status: AC | PRN
  Administered 2014-12-02: 10 mL/h via INTRAVENOUS

## 2014-12-02 MED ORDER — MIDAZOLAM HCL 2 MG/2ML IJ SOLN
INTRAMUSCULAR | Status: AC | PRN
  Administered 2014-12-02 (×2): 0.5 mg via INTRAVENOUS
  Administered 2014-12-02: 1 mg via INTRAVENOUS

## 2014-12-02 MED ORDER — FENTANYL CITRATE (PF) 100 MCG/2ML IJ SOLN
INTRAMUSCULAR | Status: AC | PRN
  Administered 2014-12-02: 25 ug via INTRAVENOUS
  Administered 2014-12-02: 50 ug via INTRAVENOUS
  Administered 2014-12-02: 25 ug via INTRAVENOUS

## 2014-12-02 MED ORDER — METRONIDAZOLE 500 MG PO TABS
500.0000 mg | ORAL_TABLET | Freq: Two times a day (BID) | ORAL | Status: AC
Start: 2014-12-02 — End: 2014-12-09

## 2014-12-02 MED ORDER — DOXYCYCLINE HYCLATE 100 MG PO CAPS: 100.0000 mg | ORAL_CAPSULE | Freq: Two times a day (BID) | ORAL | Status: DC

## 2014-12-02 MED ORDER — IBUPROFEN 600 MG PO TABS: 600.0000 mg | ORAL_TABLET | Freq: Four times a day (QID) | ORAL | Status: DC | PRN

## 2014-12-02 NOTE — Discharge Instructions (Signed)
Pelvic Inflammatory Disease °Pelvic inflammatory disease (PID) refers to an infection in some or all of the female organs. The infection can be in the uterus, ovaries, fallopian tubes, or the surrounding tissues in the pelvis. PID can cause abdominal or pelvic pain that comes on suddenly (acute pelvic pain). PID is a serious infection because it can lead to lasting (chronic) pelvic pain or the inability to have children (infertile).  °CAUSES  °The infection is often caused by the normal bacteria found in the vaginal tissues. PID may also be caused by an infection that is spread during sexual contact. PID can also occur following:  °· The birth of a baby.   °· A miscarriage.   °· An abortion.   °· Major pelvic surgery.   °· The use of an intrauterine device (IUD).   °· A sexual assault.   °RISK FACTORS °Certain factors can put a person at higher risk for PID, such as: °· Being younger than 25 years. °· Being sexually active at a young age. °· Using nonbarrier contraception. °· Having multiple sexual partners. °· Having sex with someone who has symptoms of a genital infection. °· Using oral contraception. °Other times, certain behaviors can increase the possibility of getting PID, such as: °· Having sex during your period. °· Using a vaginal douche. °· Having an intrauterine device (IUD) in place. °SYMPTOMS  °· Abdominal or pelvic pain.   °· Fever.   °· Chills.   °· Abnormal vaginal discharge. °· Abnormal uterine bleeding.   °· Unusual pain shortly after finishing your period. °DIAGNOSIS  °Your caregiver will choose some of the following methods to make a diagnosis, such as:  °· Performing a physical exam and history. A pelvic exam typically reveals a very tender uterus and surrounding pelvis.   °· Ordering laboratory tests including a pregnancy test, blood tests, and urine test.  °· Ordering cultures of the vagina and cervix to check for a sexually transmitted infection (STI). °· Performing an ultrasound.    °· Performing a laparoscopic procedure to look inside the pelvis.   °TREATMENT  °· Antibiotic medicines may be prescribed and taken by mouth.   °· Sexual partners may be treated when the infection is caused by a sexually transmitted disease (STD).   °· Hospitalization may be needed to give antibiotics intravenously. °· Surgery may be needed, but this is rare. °It may take weeks until you are completely well. If you are diagnosed with PID, you should also be checked for human immunodeficiency virus (HIV).   °HOME CARE INSTRUCTIONS  °· If given, take your antibiotics as directed. Finish the medicine even if you start to feel better.   °· Only take over-the-counter or prescription medicines for pain, discomfort, or fever as directed by your caregiver.   °· Do not have sexual intercourse until treatment is completed or as directed by your caregiver. If PID is confirmed, your recent sexual partner(s) will need treatment.   °· Keep your follow-up appointments. °SEEK MEDICAL CARE IF:  °· You have increased or abnormal vaginal discharge.   °· You need prescription medicine for your pain.   °· You vomit.   °· You cannot take your medicines.   °· Your partner has an STD.   °SEEK IMMEDIATE MEDICAL CARE IF:  °· You have a fever.   °· You have increased abdominal or pelvic pain.   °· You have chills.   °· You have pain when you urinate.   °· You are not better after 72 hours following treatment.   °MAKE SURE YOU:  °· Understand these instructions. °· Will watch your condition. °· Will get help right away if you are not doing well or get worse. °  Document Released: 06/30/2005 Document Revised: 10/25/2012 Document Reviewed: 06/26/2011 °ExitCare® Patient Information ©2015 [employer]. This information is not intended to replace advice given to you by your health care provider. Make sure you discuss any questions you have with your health care provider. ° °

## 2014-12-02 NOTE — Progress Notes (Addendum)
Subjective: Patient reports + BM and no problems voiding.  She reports that her worst pain was 2 days ago.  She reports that her pain is well controlled.  She denies N/V/F/C.      Objective: I have reviewed patient's vital signs, medications, labs and radiology results.  General: alert and no distress Resp: clear to auscultation bilaterally Cardio: regular rate and rhythm, S1, S2 normal, no murmur, click, rub or gallop GI: soft, non-tender; bowel sounds normal; no masses,  no organomegaly Extremities: extremities normal, atraumatic, no cyanosis or edema  CBC Latest Ref Rng 12/02/2014 12/01/2014 06/10/2014  WBC 4.0 - 10.5 K/uL 8.6 14.0(H) 11.1(H)  Hemoglobin 12.0 - 15.0 g/dL 10.2(L) 11.2(L) 10.9(L)  Hematocrit 36.0 - 46.0 % 30.8(L) 34.1(L) 32.7(L)  Platelets 150 - 400 K/uL 248 306 228   12/02/2014 CLINICAL DATA: Patient with history of bilateral tubo-ovarian abscess 05/2014. Evaluate for cyst or abscess. Intermittent worsening abdominal pain.  EXAM: CT ABDOMEN AND PELVIS WITH CONTRAST  TECHNIQUE: Multidetector CT imaging of the abdomen and pelvis was performed using the standard protocol following bolus administration of intravenous contrast.  CONTRAST: 50mL OMNIPAQUE IOHEXOL 300 MG/ML SOLN, 100mL OMNIPAQUE IOHEXOL 300 MG/ML SOLN  COMPARISON: CT abdomen pelvis 06/09/2014; pelvic ultrasound 12/01/2014  FINDINGS: Lower chest: No consolidative or nodular pulmonary opacities. No pleural effusion. Normal heart size.  Hepatobiliary: Liver is normal in size and contour without focal hepatic lesion identified. Gallbladder is unremarkable.  Pancreas: Unremarkable  Spleen: Unremarkable  Adrenals/Urinary Tract: Normal adrenal glands. Kidneys enhance symmetrically with contrast. Unchanged sub cm low-attenuation lesion interpolar region left kidney.  Stomach/Bowel: The appendix measures upper limits of normal at 6-7 mm. There is no significant periappendiceal fat  stranding. Overall this is similar appearance to prior evaluations. No evidence for bowel obstruction. No free intraperitoneal air.  Vascular/Lymphatic: Normal caliber abdominal aorta. No retroperitoneal lymphadenopathy.  Other: There is a large complex peripherally enhancing tubular fluid collections centered within the right adnexa most compatible with hydro or pyosalpinx. Overall this measures 8.6 x 6.4 cm. There is an additional adjacent 4.3 x 3.6 cm cystic collection which appears to be involving the right ovary. Multiple additional adjacent small fluid collections are identified.  Musculoskeletal: No aggressive or acute appearing osseous lesions.  IMPRESSION: Large complex tubular appearing cystic mass within the right adnexa appears to involve the right fallopian tube and ovary most compatible with large tubo-ovarian abscess.  The appendix measures upper limits of normal within the right lateral hemi abdomen however is not distended and does not demonstrate significant surrounding fat stranding. Findings are likely unremarkable for this patient. Recommend clinical correlation and if there are persistent clinical concerns for early appendicitis, follow-up can be obtained.  These results will be called to the ordering clinician or representative by the Radiologist Assistant, and communication documented in the PACS or zVision Dashboard.  Assessment/Plan: Pt with large right TOA.  This was noted on prior sono and seems to have persisted despite treatment.  She does not appear as ill as would be expected for an acute TOA.  Suspect that this has not resolved from prior episode. WBC decreasing. Discussed with interventional radiology.  Awaiting callback to see if pt is a candidate for IR drainage,.  Pt s/p abd/pelvic CT today Keep NPO for now Keep IV atbx Cefotan and Doxycycline   >40 min spent in coordination of care and review of films with radiologist     LOS: 1 day     Nancy Harper, Nancy Harper 12/02/2014, 1:33 PM

## 2014-12-02 NOTE — Procedures (Signed)
CT guided aspiration of the pelvic fluid collections.  2 ml of thick brown fluid obtained from the left-sided collection with a thickened wall.  Approximately 15 ml dark brown fluid removed from the fluid structure on the right side.  No immediate complication.  Minimal blood loss.

## 2014-12-02 NOTE — Sedation Documentation (Signed)
Patient denies pain and is resting comfortably.  

## 2014-12-02 NOTE — Progress Notes (Signed)
Patient ID: Nancy Harper, female   DOB: 10/22/1990, 24 y.o.   MRN: [id number] 24 yo female with complex fluid collections in the pelvis based on recent US and CT.  This appears to be an acute-on-chronic problem.  The fluid collections have enlarged since prior imaging in 2015.  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  Discussed with Dr. Erin FullingHarraway-Smith who would like to try CT guided aspiration of the collections and possible drain placement.  I discussed the procedure in depth with the patient and she is concerned about having a drain because of work but she is agreeable to the procedure. PE: Heart: RRR; Lungs: CTA; Abd: Tender in lower abdomen.  Airway: Normal Plan:  CT guided aspiration of the pelvic fluid collections with possible drain placement.

## 2014-12-03 NOTE — Discharge Summary (Signed)
Physician Discharge Summary  Patient ID: Nancy Harper MRN: [id number] DOB/AGE: [birth year] 24 y.o.  Admit date: 12/01/2014 Discharge date: 12/03/2014  Admission Diagnoses: right TOA  Discharge Diagnoses:  Active Problems:   Bacterial vaginosis   Tubo-ovarian abscess   Adnexal mass   Pelvic pain in female   Right tubo-ovarian abscess   Discharged Condition: good  Hospital Course: Pt was admitted for eval of right TOA. Pt had sx previously.  Review of radiology results indicated that this was probably a chronic TOA vs hydrosalpinx. There is a fluid filled lesion on the fallopian tube and a separate fluid collection of the right ovary. An attempt was made to drain these by interventional rad and old hemosiderin was noted in both.  They were not able to be drained completely.   After procedure pt reported no pain.  She requested discharge so that she could complete training for her new job.  She was afebrile, pain free and tolerating a regular diet.  I discussed with her evenual laparoscopy to eval and remove the fallopian tube and drain the ovary.  Pt would like this scheduled after she gets settled on her job.     Consults: Int Radiology  Significant Diagnostic Studies: radiology: CT scan: abd/pelvis  Treatments: procedures: percutaneous drainage WITHOUT catheter placement.  Discharge Exam: Blood pressure 113/67, pulse 70, temperature 98.6 F (37 C), temperature source Oral, resp. rate 20, height 5\' 4"  (1.626 m), weight 165 lb (74.844 kg), last menstrual period 11/13/2014, SpO2 100 %. General appearance: alert and no distress  Abd: soft, NT; ND.  Bandages in place and dry  Disposition: 01-Home or Self Care  Discharge Instructions    Call MD for:  difficulty breathing, headache or visual disturbances    Complete by:  As directed      Call MD for:  persistant nausea and vomiting    Complete by:  As directed      Call MD for:  redness, tenderness, or signs of infection (pain,  swelling, redness, odor or green/yellow discharge around incision site)    Complete by:  As directed      Call MD for:  severe uncontrolled pain    Complete by:  As directed      Call MD for:  temperature >100.4    Complete by:  As directed      Diet general    Complete by:  As directed      Increase activity slowly    Complete by:  As directed             Medication List    TAKE these medications        doxycycline 100 MG capsule  Commonly known as:  VIBRAMYCIN  Take 1 capsule (100 mg total) by mouth 2 (two) times daily.     ibuprofen 600 MG tablet  Commonly known as:  ADVIL,MOTRIN  Take 1 tablet (600 mg total) by mouth every 6 (six) hours as needed (mild pain).     medroxyPROGESTERone 150 MG/ML injection  Commonly known as:  DEPO-PROVERA  Inject 150 mg into the muscle every 3 (three) months. Administered on 11/13/14.     metroNIDAZOLE 500 MG tablet  Commonly known as:  FLAGYL  Take 1 tablet (500 mg total) by mouth 2 (two) times daily.     oxyCODONE-acetaminophen 5-325 MG per tablet  Commonly known as:  PERCOCET/ROXICET  Take 1 tablet by mouth every 4 (four) hours as needed for severe pain.  Follow-up Information    Follow up with Willodean Rosenthal, MD In 2 weeks.   Specialty:  Obstetrics and Gynecology   Contact information:   [address] [place] [address] [phone]       Signed: Willodean Rosenthal 12/03/2014, 9:46 AM

## 2014-12-04 ENCOUNTER — Encounter: Payer: Self-pay | Admitting: Obstetrics & Gynecology

## 2014-12-04 LAB — GC/CHLAMYDIA PROBE AMP (~~LOC~~) NOT AT ARMC
CHLAMYDIA, DNA PROBE: NEGATIVE
NEISSERIA GONORRHEA: NEGATIVE

## 2014-12-06 LAB — BODY FLUID CULTURE: Culture: NO GROWTH

## 2014-12-07 LAB — BODY FLUID CULTURE: CULTURE: NO GROWTH

## 2014-12-27 ENCOUNTER — Ambulatory Visit: Payer: Self-pay | Admitting: Obstetrics & Gynecology

## 2015-01-12 ENCOUNTER — Encounter: Payer: Self-pay | Admitting: Obstetrics & Gynecology

## 2015-01-12 ENCOUNTER — Ambulatory Visit (INDEPENDENT_AMBULATORY_CARE_PROVIDER_SITE_OTHER): Payer: Self-pay | Admitting: Obstetrics & Gynecology

## 2015-01-12 DIAGNOSIS — N7093 Salpingitis and oophoritis, unspecified: Secondary | ICD-10-CM

## 2015-01-12 MED ORDER — HYDROCODONE-ACETAMINOPHEN 5-325 MG PO TABS: 1.0000 | ORAL_TABLET | Freq: Four times a day (QID) | ORAL | Status: DC | PRN

## 2015-01-12 MED ORDER — IBUPROFEN 600 MG PO TABS: 600.0000 mg | ORAL_TABLET | Freq: Four times a day (QID) | ORAL | Status: DC | PRN

## 2015-01-12 NOTE — Progress Notes (Signed)
  Subjective: CC: follow up for chronic tubo ovarian abscess HPI: Patient is a 24 y.o. female presenting to clinic today for follow up. Concerns today include:  Tubo ovarian abscess Discharged from hospital in May.  Missed hospital follow up because didn't have a ride.  She reports continued sharp pain in the right side of her abdomen that shoots down to her leg.  Not taking Oxycodone because not helping pain just making her sleepy.  She reports she has been taking her boyfriend's mom's hydrocodone, which helps when combined with motrin.  Worse with movement, esp getting up from a seated position.  No nausea or vomiting in at least 2 weeks.  No fevers.  Completed antibiotics already had at home and started abx that were prescribed at last hospitalization about 2 weeks ago.  Thinks she has about 1/2 a bottle left.    Social History Reviewed: occasional smoker.  ROS: All other systems reviewed and are negative.  Objective: Office vital signs reviewed. 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

## 2015-01-12 NOTE — Patient Instructions (Signed)
You should follow up with us in 2 weeks.  We will discuss referral at that time.  Continue to take your antibiotics as prescribed.  You are being prescribed pain medication.  Take these exactly as directed.  Caution sedation with this medication.

## 2015-01-26 ENCOUNTER — Ambulatory Visit: Payer: Self-pay | Admitting: Medical

## 2015-01-26 ENCOUNTER — Ambulatory Visit: Payer: Self-pay | Admitting: Obstetrics & Gynecology

## 2015-03-26 ENCOUNTER — Telehealth: Payer: Self-pay | Admitting: *Deleted

## 2015-03-26 NOTE — Telephone Encounter (Signed)
Returned patient call. Patient stated she needs to get records for her employer showing her medical condition because she is unable to work due to pain. Last seen in clinic 01/12/15, missed follow up appointment. Advised patient to schedule an appointment to be seen so the physician can determine whether she is able to work and she can get additional treatment if needed. Understanding voiced.

## 2015-03-26 NOTE — Telephone Encounter (Signed)
Pt called requesting a callback to discuss records.

## 2015-05-09 IMAGING — CT CT ABD-PELV W/ CM
2 of 5 series · 15 of 46 positions shown, 17 images · IV contrast (APPLIED)
Comparison: Prior CT scan of the abdomen and pelvis 09/19/2013

CLINICAL DATA: 23-year-old female with right-sided abdominal pain
accompanied by nausea and vomiting.

EXAM:
CT ABDOMEN AND PELVIS WITH CONTRAST
TECHNIQUE: Multidetector CT imaging of the abdomen and pelvis was performed
using the standard protocol following bolus administration of
intravenous contrast.
CONTRAST:  25mL OMNIPAQUE IOHEXOL 300 MG/ML SOLN, 100mL OMNIPAQUE
IOHEXOL 300 MG/ML SOLN

[Series 2: abd/pelvis 5.0 b31f · axial · 0.76mm/px · z∈[-436,+4]mm · 12 of 98 slices shown, 14 images]
[im 5/98  soft-tissue]
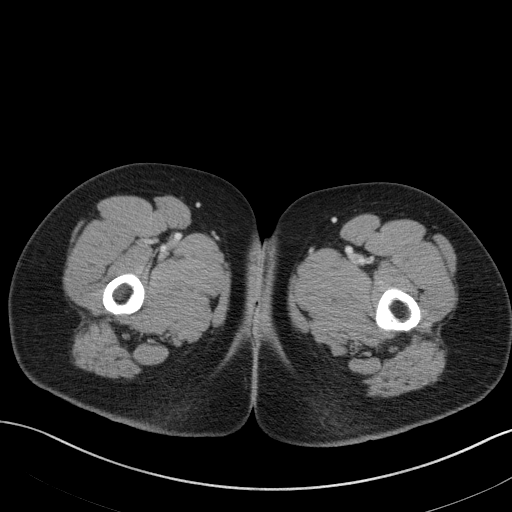
[im 5/98  bone]
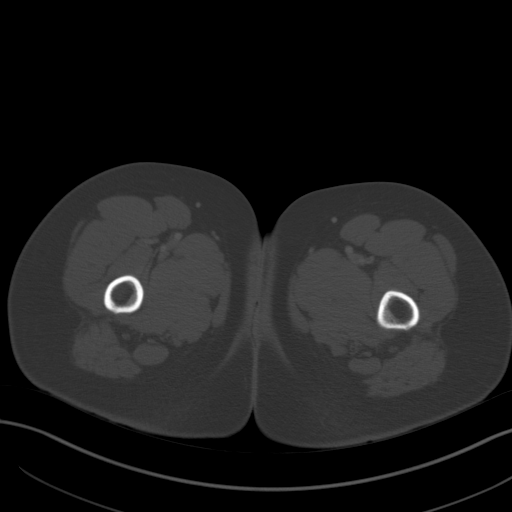
[im 15/98  soft-tissue]
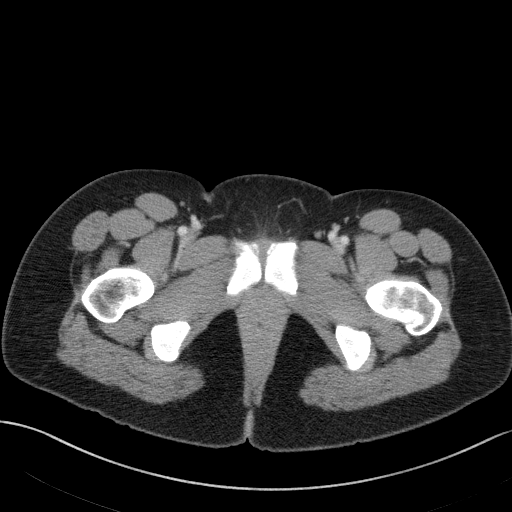
[im 20/98  soft-tissue]
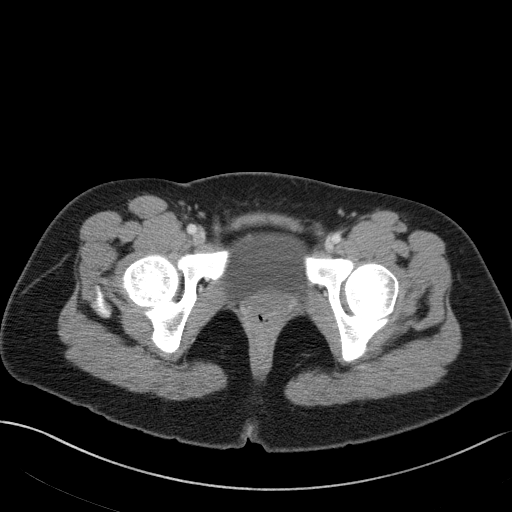
[im 30/98  soft-tissue]
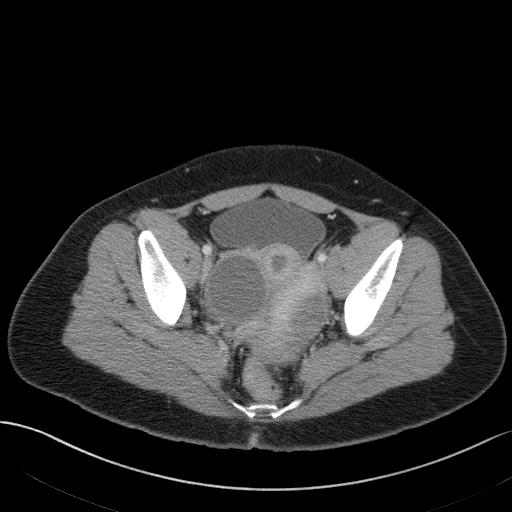
[im 39/98  soft-tissue]
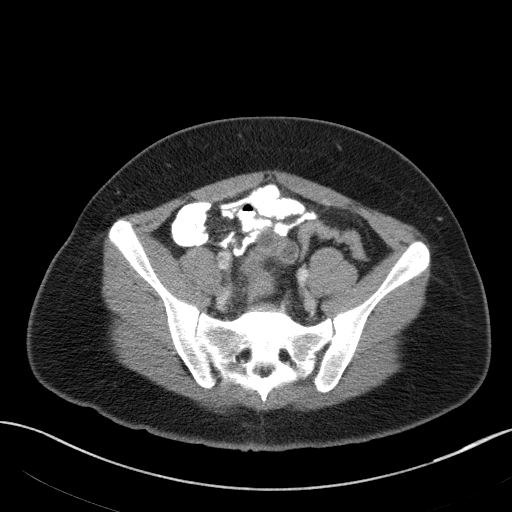
[im 44/98  soft-tissue]
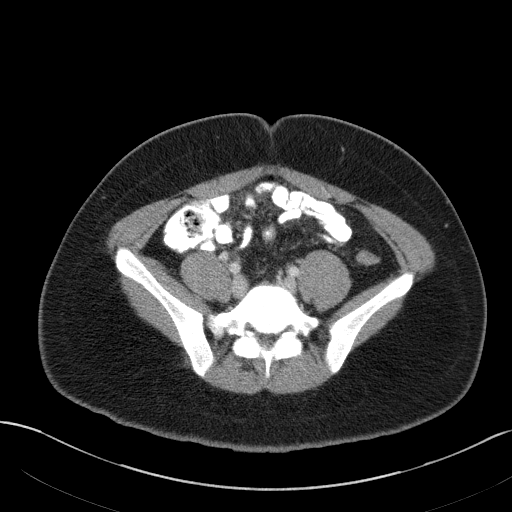
[im 54/98  soft-tissue]
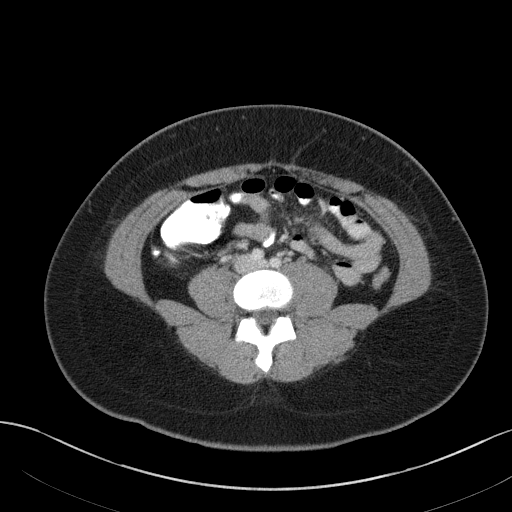
[im 59/98  soft-tissue]
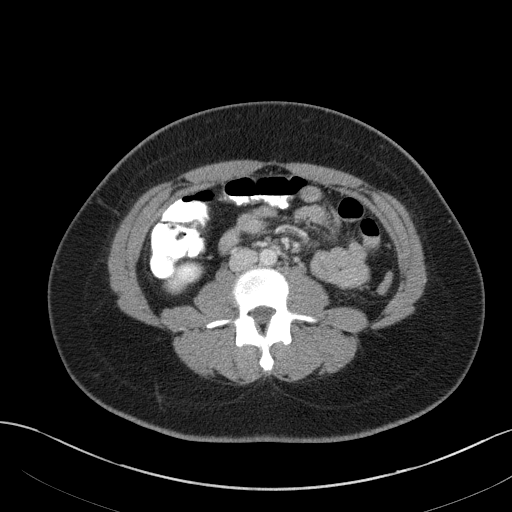
[im 68/98  soft-tissue]
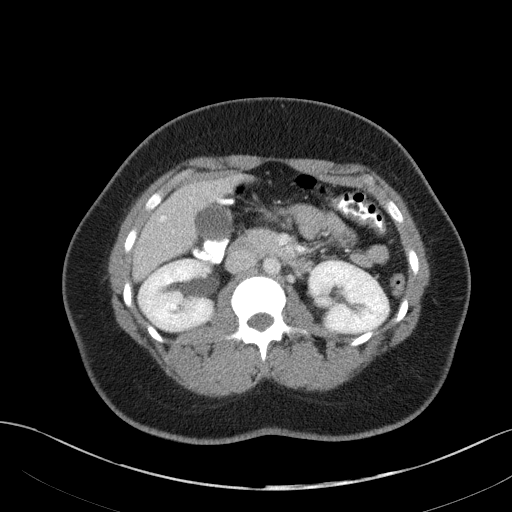
[im 68/98  bone]
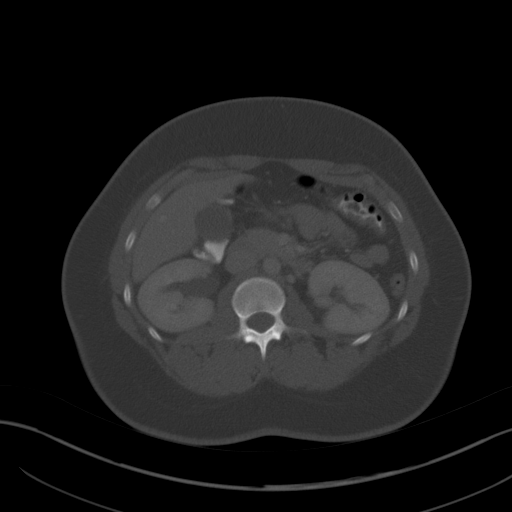
[im 78/98  soft-tissue]
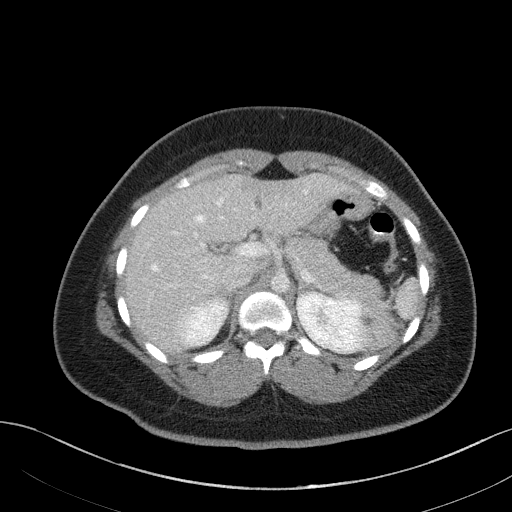
[im 83/98  soft-tissue]
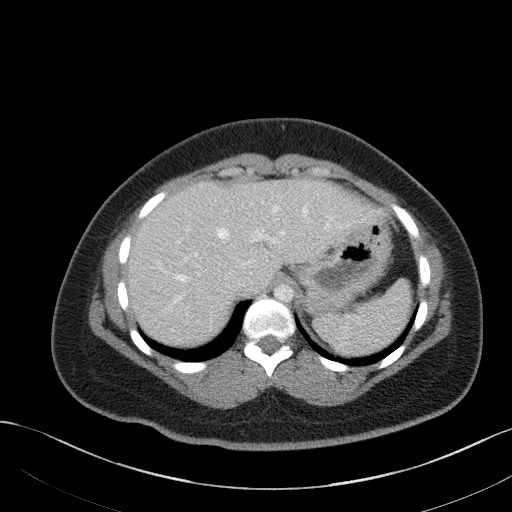
[im 93/98  soft-tissue]
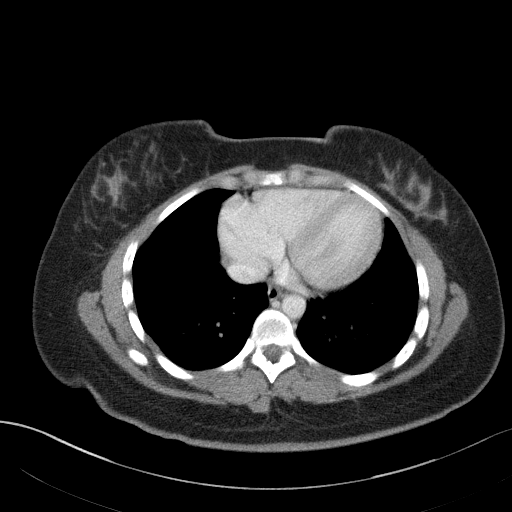

[Series 5: abd/pelvis 3.0 coronal · coronal · 0.81mm/px · 3 of 90 slices shown]
[im 30/90  soft-tissue]
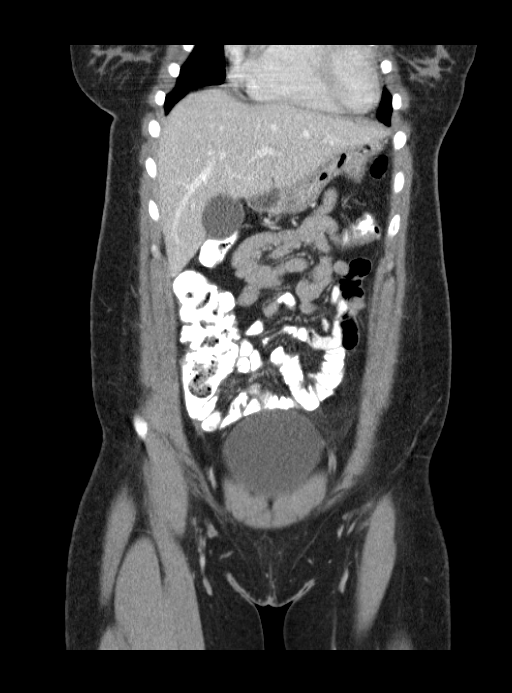
[im 40/90  soft-tissue]
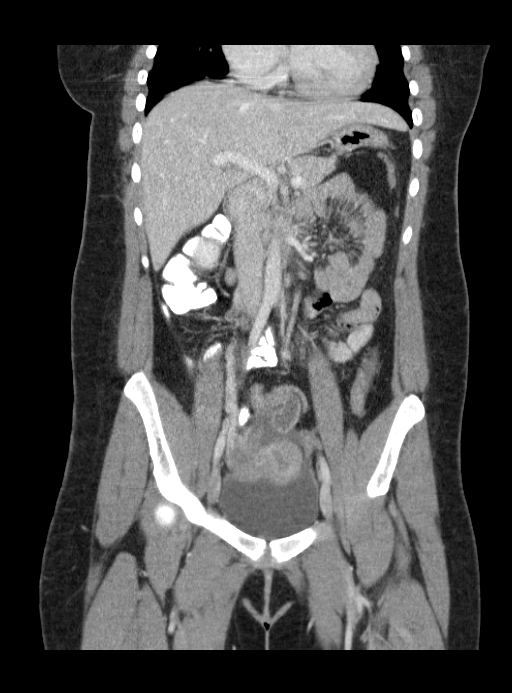
[im 50/90  soft-tissue]
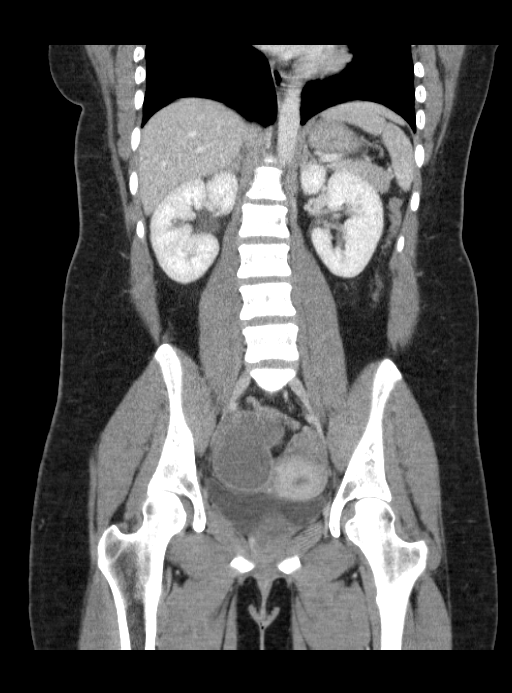

[15 of 46 positions shown; findings below may reference images not displayed]

FINDINGS: Lower Chest: The lung bases are clear. Visualized cardiac structures
are within normal limits for size. No pericardial effusion.
Unremarkable visualized distal thoracic esophagus.

Abdomen: Unremarkable CT appearance of the stomach, duodenum,
spleen, adrenal glands and pancreas. Normal hepatic contour and
morphology. No discrete hepatic lesion. Gallbladder is unremarkable.
No intra or extrahepatic biliary ductal dilatation. Unremarkable
appearance of the bilateral kidneys. No focal solid lesion,
hydronephrosis or nephrolithiasis. No evidence of obstruction or
focal bowel wall thickening. Normal appendix in the right lower
quadrant. The terminal ileum is unremarkable.

Pelvis: Complex peripherally enhancing tubular fluid collection
centered in the region of the right adnexum most consistent with a
hydro or pyosalpinx. Given the relative hyper enhancement of the
mucosal lining, pyosalpinx is favored. There is an inseparable 5.2 x
4.7 x 5.9 cm intermediate attenuation cystic collection. This
appears to be arising from the ovary. There is a separate small
peripherally enhancing fluid collection also within the ovary which
may represent a corpus luteum. There is a smaller tubular fluid
collection in the left aspect of the anatomic pelvis and a smaller
2.8 x 2.2 cm cystic collection in the left ovary. The uterus itself
is unremarkable. No free fluid.

Bones/Soft Tissues: No acute fracture or aggressive appearing lytic
or blastic osseous lesion.

Vascular: No significant atherosclerotic vascular disease,
aneurysmal dilatation or acute abnormality.
IMPRESSION: 1. CT findings are most consistent with bilateral right worse than
left tubo-ovarian abscesses. Consider further evaluation with pelvic
ultrasound.
2. The appendix is normal.

## 2015-10-20 ENCOUNTER — Encounter (HOSPITAL_BASED_OUTPATIENT_CLINIC_OR_DEPARTMENT_OTHER): Payer: Self-pay

## 2015-10-20 ENCOUNTER — Emergency Department (HOSPITAL_BASED_OUTPATIENT_CLINIC_OR_DEPARTMENT_OTHER)
Admission: EM | Admit: 2015-10-20 | Discharge: 2015-10-20 | Disposition: A | Discharge status: Home / Self Care | Payer: Self-pay | Attending: Emergency Medicine | Admitting: Emergency Medicine

## 2015-10-20 DIAGNOSIS — L0291 Cutaneous abscess, unspecified: Secondary | ICD-10-CM

## 2015-10-20 DIAGNOSIS — L0231 Cutaneous abscess of buttock: Secondary | ICD-10-CM | POA: Insufficient documentation

## 2015-10-20 DIAGNOSIS — F17210 Nicotine dependence, cigarettes, uncomplicated: Secondary | ICD-10-CM | POA: Insufficient documentation

## 2015-10-20 MED ORDER — SULFAMETHOXAZOLE-TRIMETHOPRIM 800-160 MG PO TABS
1.0000 | ORAL_TABLET | Freq: Two times a day (BID) | ORAL | Status: AC
Start: 2015-10-20 — End: 2015-10-27

## 2015-10-20 MED ORDER — LIDOCAINE HCL 2 % IJ SOLN
10.0000 mL | Freq: Once | INTRAMUSCULAR | Status: AC
  Administered 2015-10-20: 200 mg via INTRADERMAL
  Filled 2015-10-20: qty 20

## 2015-10-20 NOTE — Discharge Instructions (Signed)
Abscess °An abscess is an infected area that contains a collection of pus and debris. It can occur in almost any part of the body. An abscess is also known as a furuncle or boil. °CAUSES  °An abscess occurs when tissue gets infected. This can occur from blockage of oil or sweat glands, infection of hair follicles, or a minor injury to the skin. As the body tries to fight the infection, pus collects in the area and creates pressure under the skin. This pressure causes pain. People with weakened immune systems have difficulty fighting infections and get certain abscesses more often.  °SYMPTOMS °Usually an abscess develops on the skin and becomes a painful mass that is red, warm, and tender. If the abscess forms under the skin, you may feel a moveable soft area under the skin. Some abscesses break open (rupture) on their own, but most will continue to get worse without care. The infection can spread deeper into the body and eventually into the bloodstream, causing you to feel ill.  °DIAGNOSIS  °Your caregiver will take your medical history and perform a physical exam. A sample of fluid may also be taken from the abscess to determine what is causing your infection. °TREATMENT  °Your caregiver may prescribe antibiotic medicines to fight the infection. However, taking antibiotics alone usually does not cure an abscess. Your caregiver may need to make a small cut (incision) in the abscess to drain the pus. In some cases, gauze is packed into the abscess to reduce pain and to continue draining the area. °HOME CARE INSTRUCTIONS  °· Only take over-the-counter or prescription medicines for pain, discomfort, or fever as directed by your caregiver. °· If you were prescribed antibiotics, take them as directed. Finish them even if you start to feel better. °· If gauze is used, follow your caregiver's directions for changing the gauze. °· To avoid spreading the infection: °· Keep your draining abscess covered with a  bandage. °· Wash your hands well. °· Do not share personal care items, towels, or whirlpools with others. °· Avoid skin contact with others. °· Keep your skin and clothes clean around the abscess. °· Keep all follow-up appointments as directed by your caregiver. °SEEK MEDICAL CARE IF:  °· You have increased pain, swelling, redness, fluid drainage, or bleeding. °· You have muscle aches, chills, or a general ill feeling. °· You have a fever. °MAKE SURE YOU:  °· Understand these instructions. °· Will watch your condition. °· Will get help right away if you are not doing well or get worse. °  °This information is not intended to replace advice given to you by your health care provider. Make sure you discuss any questions you have with your health care provider. °  °Document Released: 04/09/2005 Document Revised: 12/30/2011 Document Reviewed: 09/12/2011 °Elsevier Interactive Patient Education ©2016 [employer] ° °Incision and Drainage °Incision and drainage is a procedure in which a sac-like structure (cystic structure) is opened and drained. The area to be drained usually contains material such as pus, fluid, or blood.  °LET YOUR CAREGIVER KNOW ABOUT:  °· Allergies to medicine. °· Medicines taken, including vitamins, herbs, eyedrops, over-the-counter medicines, and creams. °· Use of steroids (by mouth or creams). °· Previous problems with anesthetics or numbing medicines. °· History of bleeding problems or blood clots. °· Previous surgery. °· Other health problems, including diabetes and kidney problems. °· Possibility of pregnancy, if this applies. °RISKS AND COMPLICATIONS °· Pain. °· Bleeding. °· Scarring. °· Infection. °BEFORE THE PROCEDURE  °  You may need to have an ultrasound or other imaging tests to see how large or deep your cystic structure is. Blood tests may also be used to determine if you have an infection or how severe the infection is. You may need to have a tetanus shot. °PROCEDURE  °The affected area  is cleaned with a cleaning fluid. The cyst area will then be numbed with a medicine (local anesthetic). A small incision will be made in the cystic structure. A syringe or catheter may be used to drain the contents of the cystic structure, or the contents may be squeezed out. The area will then be flushed with a cleansing solution. After cleansing the area, it is often gently packed with a gauze or another wound dressing. Once it is packed, it will be covered with gauze and tape or some other type of wound dressing.  °AFTER THE PROCEDURE  °· Often, you will be allowed to go home right after the procedure. °· You may be given antibiotic medicine to prevent or heal an infection. °· If the area was packed with gauze or some other wound dressing, you will likely need to come back in 1 to 2 days to get it removed. °· The area should heal in about 14 days. °  °This information is not intended to replace advice given to you by your health care provider. Make sure you discuss any questions you have with your health care provider. °  °Document Released: 12/24/2000 Document Revised: 12/30/2011 Document Reviewed: 08/25/2011 °Elsevier Interactive Patient Education ©2016 Elsevier Inc[employer] ° °

## 2015-10-20 NOTE — ED Notes (Signed)
PA at bedside.

## 2015-10-20 NOTE — ED Provider Notes (Signed)
CSN: [id number]     Arrival date & time 10/20/15  0947 History   First MD Initiated Contact with Patient 10/20/15 [phone]     Chief Complaint  Patient presents with  . rectal pain    Patient is a 25 y.o. female presenting with abscess.  Abscess Location:  Ano-genital Ano-genital abscess location:  R buttock Size:  4 cm Abscess quality: fluctuance and induration   Abscess quality: not draining, not painful, no redness and no warmth   Red streaking: no   Duration:  2 days Progression:  Unchanged Chronicity:  Recurrent Relieved by:  None tried Exacerbated by: Sitting. Ineffective treatments:  None tried Associated symptoms: no fever, no nausea and no vomiting   Risk factors: prior abscess    Nancy Harper is a 25 year old female presenting with an abscess and rectal pain. Patient reports aching pain when sitting for the past 2 days. Standing alleviates the pain. She reports feeling a lump on her right buttock. The area is not painful to palpation. She reports a similar complaint hives months ago. She treated that with hemorrhoid cream and it resolved without an emergency department visit. She has not tried any home remedies for the current abscess. She denies systemic symptoms including fever, chills, nausea or vomiting. She denies painful bowel movements or blood in stool. She has no other complaints today.  Past Medical History  Diagnosis Date  . Pelvic abscess in female 11/2013    Admitted at [facility], underwent IR drainage   Past Surgical History  Procedure Laterality Date  . Vaginal septum resection  2003    Dr. April Manson, [facility]   No family history on file. Social History  Substance Use Topics  . Smoking status: Current Some Day Smoker    Types: Cigarettes  . Smokeless tobacco: None  . Alcohol Use: 0.6 oz/week    1 Glasses of wine per week   OB History    Gravida Para Term Preterm AB TAB SAB Ectopic Multiple Living       Review of Systems   Constitutional: Negative for fever.  Gastrointestinal: Negative for nausea and vomiting.  All other systems reviewed and are negative.     Allergies  Review of patient's allergies indicates no known allergies.  Home Medications   Prior to Admission medications   Medication Sig Start Date End Date Taking? Authorizing Provider  sulfamethoxazole-trimethoprim (BACTRIM DS,SEPTRA DS) 800-160 MG tablet Take 1 tablet by mouth 2 (two) times daily. 10/20/15 10/27/15  Symphony Demuro, PA-C   BP 129/86 mmHg  Pulse 86  Temp(Src) 97.9 F (36.6 C) (Oral)  Resp 18  Ht  (1.575 m)  Wt 76.006 kg  BMI 30.64 kg/m2  SpO2 100% Physical Exam  Constitutional: She appears well-developed and well-nourished. No distress.  Nontoxic appearing  HENT:  Head: Normocephalic and atraumatic.  Right Ear: External ear normal.  Left Ear: External ear normal.  Eyes: Conjunctivae are normal. Right eye exhibits no discharge. Left eye exhibits no discharge. No scleral icterus.  Neck: Normal range of motion.  Cardiovascular: Normal rate.   Pulmonary/Chest: Effort normal.  Genitourinary: Rectal exam shows no external hemorrhoid.  4 cm abscess of right buttock along the gluteal cleft. Surrounding induration with small amount of central fluctuance. No active draining. No overlying erythema or streaking. Abscess is not TTP. No external hemorrhoids noted.   Musculoskeletal: Normal range of motion.  Moves all extremities spontaneously  Neurological: She is  alert. Coordination normal.  Skin: Skin is warm and dry.  Psychiatric: She has a normal mood and affect. Her behavior is normal.  Nursing note and vitals reviewed.   ED Course  .Marland Kitchen.Incision and Drainage Date/Time: 10/20/2015 11:14 AM Performed by: Alveta HeimlichBARRETT, Docie Abramovich Authorized by: Alveta HeimlichBARRETT, Mishelle Hassan Consent: Verbal consent obtained. Risks and benefits: risks, benefits and alternatives were discussed Consent given by: patient Patient understanding: patient states  understanding of the procedure being performed Patient consent: the patient's understanding of the procedure matches consent given Procedure consent: procedure consent matches procedure scheduled Required items: required blood products, implants, devices, and special equipment available Patient identity confirmed: verbally with patient Type: abscess Body area: anogenital Location details: gluteal cleft Anesthesia: local infiltration Local anesthetic: lidocaine 2% without epinephrine Anesthetic total: 4 ml Patient sedated: no Scalpel size: 11 Incision type: single straight Complexity: simple Drainage: purulent Drainage amount: scant Wound treatment: wound left open Packing material: 1/4 in iodoform gauze Patient tolerance: Patient tolerated the procedure well with no immediate complications   (including critical care time) Labs Review Labs Reviewed - No data to display  Imaging Review No results found. I have personally reviewed and evaluated these images and lab results as part of my medical decision-making.   EKG Interpretation None      MDM   Final diagnoses:  Abscess   Patient presenting with skin abscess of the right buttock amenable to incision and drainage.  Patient tolerated the procedure well. Small amount of purulent drainage. Packing inserted. Will d/c with bactrim. Encouraged warm soaks at home and keeping the wound clean and dry. Instructed to go to PCP or urgent care in 2 days for wound recheck. Patient expresses understanding and is stable for discharge.      Rolm GalaStevi Shuronda Santino, PA-C 10/20/15 1118  Rolan BuccoMelanie Belfi, MD 10/20/15 1426

## 2015-10-20 NOTE — ED Notes (Signed)
Patient here with rectal pain when sitting x 2 days, denies drainage. No pain with bowel movement and no bleeding noted

## 2016-05-06 ENCOUNTER — Emergency Department (HOSPITAL_BASED_OUTPATIENT_CLINIC_OR_DEPARTMENT_OTHER)
Admission: EM | Admit: 2016-05-06 | Discharge: 2016-05-06 | Disposition: A | Discharge status: Home / Self Care | Payer: Self-pay | Attending: Emergency Medicine | Admitting: Emergency Medicine

## 2016-05-06 ENCOUNTER — Encounter (HOSPITAL_BASED_OUTPATIENT_CLINIC_OR_DEPARTMENT_OTHER): Payer: Self-pay

## 2016-05-06 DIAGNOSIS — R6889 Other general symptoms and signs: Secondary | ICD-10-CM

## 2016-05-06 DIAGNOSIS — F17210 Nicotine dependence, cigarettes, uncomplicated: Secondary | ICD-10-CM | POA: Insufficient documentation

## 2016-05-06 DIAGNOSIS — J111 Influenza due to unidentified influenza virus with other respiratory manifestations: Secondary | ICD-10-CM | POA: Insufficient documentation

## 2016-05-06 LAB — CBC WITH DIFFERENTIAL/PLATELET
BASOS ABS: 0 10*3/uL (ref 0.0–0.1)
BASOS PCT: 0 %
EOS ABS: 0.1 10*3/uL (ref 0.0–0.7)
EOS PCT: 1 %
HCT: 35.5 % — ABNORMAL LOW (ref 36.0–46.0)
Hemoglobin: 11.9 g/dL — ABNORMAL LOW (ref 12.0–15.0)
LYMPHS PCT: 15 %
Lymphs Abs: 2.1 10*3/uL (ref 0.7–4.0)
MCH: 28.7 pg (ref 26.0–34.0)
MCHC: 33.5 g/dL (ref 30.0–36.0)
MCV: 85.5 fL (ref 78.0–100.0)
Monocytes Absolute: 1.3 10*3/uL — ABNORMAL HIGH (ref 0.1–1.0)
Monocytes Relative: 9 %
Neutro Abs: 10.8 10*3/uL — ABNORMAL HIGH (ref 1.7–7.7)
Neutrophils Relative %: 75 %
PLATELETS: 186 10*3/uL (ref 150–400)
RBC: 4.15 MIL/uL (ref 3.87–5.11)
RDW: 13 % (ref 11.5–15.5)
WBC: 14.3 10*3/uL — AB (ref 4.0–10.5)

## 2016-05-06 LAB — COMPREHENSIVE METABOLIC PANEL
ALK PHOS: 69 U/L (ref 38–126)
ALT: 23 U/L (ref 14–54)
ANION GAP: 8 (ref 5–15)
AST: 24 U/L (ref 15–41)
Albumin: 3.5 g/dL (ref 3.5–5.0)
BILIRUBIN TOTAL: 0.4 mg/dL (ref 0.3–1.2)
BUN: 5 mg/dL — AB (ref 6–20)
CALCIUM: 8.5 mg/dL — AB (ref 8.9–10.3)
CO2: 20 mmol/L — ABNORMAL LOW (ref 22–32)
Chloride: 106 mmol/L (ref 101–111)
Creatinine, Ser: 0.81 mg/dL (ref 0.44–1.00)
GFR calc Af Amer: >60 mL/min (ref >60)
Glucose, Bld: 173 mg/dL — ABNORMAL HIGH (ref 65–99)
POTASSIUM: 3.3 mmol/L — AB (ref 3.5–5.1)
Sodium: 134 mmol/L — ABNORMAL LOW (ref 135–145)
TOTAL PROTEIN: 7.3 g/dL (ref 6.5–8.1)

## 2016-05-06 MED ORDER — ACETAMINOPHEN 500 MG PO TABS
1000.0000 mg | ORAL_TABLET | Freq: Once | ORAL | Status: AC
  Administered 2016-05-06: 1000 mg via ORAL
  Filled 2016-05-06: qty 2

## 2016-05-06 MED ORDER — SODIUM CHLORIDE 0.9 % IV SOLN: INTRAVENOUS | Status: DC

## 2016-05-06 MED ORDER — SODIUM CHLORIDE 0.9 % IV BOLUS (SEPSIS)
1000.0000 mL | Freq: Once | INTRAVENOUS | Status: AC
  Administered 2016-05-06: 1000 mL via INTRAVENOUS

## 2016-05-06 MED ORDER — IBUPROFEN 800 MG PO TABS
800.0000 mg | ORAL_TABLET | Freq: Once | ORAL | Status: AC
  Administered 2016-05-06: 800 mg via ORAL
  Filled 2016-05-06: qty 1

## 2016-05-06 NOTE — ED Triage Notes (Addendum)
Pt c/o bodyaches and sweats, funny nose and slight cough, did not know she had a fever.  She denies n/v, has been taking ibuprofen, last dose was yesterday at 1700.  Pt not in any distress, texting in triage

## 2016-05-06 NOTE — ED Provider Notes (Signed)
[facility] Provider Note   CSN: [id number] Arrival date & time: 05/06/16  [id number]     History   Chief Complaint Chief Complaint  Patient presents with  . Fever    HPI Nancy Harper is a 25 y.o. female.  Patient with complaint of fevers chills sweats since Sunday. No nausea vomiting or diarrhea no congestion no cough no sore throat. No dysuria no pelvic pain. No rash. Patient has had bodyaches. Patient states she's feels like she's coming down with the flu.      Past Medical History:  Diagnosis Date  . Pelvic abscess in female 11/2013   [facility], [facility]    Patient Active Problem List   Diagnosis Date Noted  . Adnexal mass   . Pelvic pain in female   . Right tubo-ovarian abscess   . Tubo-ovarian abscess 12/01/2014  . Bacterial vaginosis 06/10/2014  . Normocytic anemia 06/10/2014  . TOA (tubo-ovarian abscess); bilateral 06/09/2014    Past Surgical History:  Procedure Laterality Date  . VAGINAL SEPTUM RESECTION  2003   [facility]    OB History    Gravida Para Term Preterm AB Living   0 0 0 0 0 0   SAB TAB Ectopic Multiple Live Births   0 0 0 0         Home Medications    Prior to Admission medications   Not on File    Family History No family history on file.  Social History Social History  Substance Use Topics  . Smoking status: Current Some Day Smoker    Types: Cigarettes  . Smokeless tobacco: Not on file  . Alcohol use 0.6 oz/week    1 Glasses of wine per week     Allergies   Review of patient's allergies indicates no known allergies.   Review of Systems Review of Systems  Constitutional: Positive for chills, diaphoresis, fatigue and fever.  HENT: Negative for congestion and sore throat.   Eyes: Negative for redness.  Respiratory: Negative for shortness of breath.   Cardiovascular: Negative for chest pain.  Gastrointestinal: Negative for abdominal pain, diarrhea and  vomiting.  Genitourinary: Negative for dysuria, flank pain and hematuria.  Musculoskeletal: Positive for myalgias.  Skin: Negative for rash.  Neurological: Negative for headaches.  Hematological: Does not bruise/bleed easily.  Psychiatric/Behavioral: Negative for confusion.     Physical Exam Updated Vital Signs BP 121/70 (BP Location: Right Arm)   Pulse 88   Temp 98.3 F (36.8 C) (Oral)   Resp 18   Ht 5\' 4"  (1.626 m)   Wt 73.9 kg   LMP 04/28/2016 Comment: pt on depo provera  SpO2 100%   BMI 27.98 kg/m   Physical Exam  Constitutional: She is oriented to person, place, and time. She appears well-developed and well-nourished. No distress.  HENT:  Head: Normocephalic and atraumatic.  Mouth/Throat: Oropharynx is clear and moist. No oropharyngeal exudate.  Eyes: Conjunctivae and EOM are normal. Pupils are equal, round, and reactive to light.  Neck: Normal range of motion. Neck supple.  Cardiovascular: Normal rate, regular rhythm and normal heart sounds.   Pulmonary/Chest: Effort normal and breath sounds normal. No respiratory distress. She has no wheezes.  Abdominal: Soft. Bowel sounds are normal. There is no tenderness.  Musculoskeletal: Normal range of motion. She exhibits no edema.  Neurological: She is alert and oriented to person, place, and time. No cranial nerve deficit. Coordination normal.  Skin: Skin is warm. No  rash noted.  Nursing note and vitals reviewed.    ED Treatments / Results  Labs (all labs ordered are listed, but only abnormal results are displayed) Labs Reviewed  COMPREHENSIVE METABOLIC PANEL - Abnormal; Notable for the following:       Result Value   Sodium 134 (*)    Potassium 3.3 (*)    CO2 20 (*)    Glucose, Bld 173 (*)    BUN 5 (*)    Calcium 8.5 (*)    All other components within normal limits  CBC WITH DIFFERENTIAL/PLATELET - Abnormal; Notable for the following:    WBC 14.3 (*)    Hemoglobin 11.9 (*)    HCT 35.5 (*)    Neutro Abs 10.8  (*)    Monocytes Absolute 1.3 (*)    All other components within normal limits    EKG  EKG Interpretation None       Radiology No results found.  Procedures Procedures (including critical care time)  Medications Ordered in ED Medications  0.9 %  sodium chloride infusion (not administered)  ibuprofen (ADVIL,MOTRIN) tablet 800 mg (800 mg Oral Given 05/06/16 0628)  acetaminophen (TYLENOL) tablet 1,000 mg (1,000 mg Oral Given 05/06/16 0645)  sodium chloride 0.9 % bolus 1,000 mL (1,000 mLs Intravenous New Bag/Given 05/06/16 0755)     Initial Impression / Assessment and Plan / ED Course  I have reviewed the triage vital signs and the nursing notes.  Pertinent labs & imaging results that were available during my care of the patient were reviewed by me and considered in my medical decision making (see chart for details).  Clinical Course    Patient symptoms flulike in nature. But mostly just fever and body aches. Patient without any upper respiratory type symptoms at this time. Patient improved here with IV fluids. Patient will continue Tylenol and Motrin for body aches and fever. She'll return for any new or worse symptoms. Work note provided. Patient currently nontoxic no acute distress.  Final Clinical Impressions(s) / ED Diagnoses   Final diagnoses:  Flu-like symptoms    New Prescriptions New Prescriptions   No medications on file     Vanetta Mulders, MD 05/06/16 [phone]

## 2016-05-06 NOTE — Discharge Instructions (Signed)
Continue take Tylenol and/or Motrin for the fevers and bodyaches. Return for any new or worse symptoms. Suspect a flulike illness. Work note provided.

## 2017-10-07 ENCOUNTER — Other Ambulatory Visit: Payer: Self-pay

## 2017-10-07 ENCOUNTER — Emergency Department (HOSPITAL_BASED_OUTPATIENT_CLINIC_OR_DEPARTMENT_OTHER)
Admission: EM | Admit: 2017-10-07 | Discharge: 2017-10-07 | Disposition: A | Discharge status: Home / Self Care | Payer: Self-pay | Attending: Emergency Medicine | Admitting: Emergency Medicine

## 2017-10-07 ENCOUNTER — Encounter (HOSPITAL_BASED_OUTPATIENT_CLINIC_OR_DEPARTMENT_OTHER): Payer: Self-pay | Admitting: Emergency Medicine

## 2017-10-07 DIAGNOSIS — F17210 Nicotine dependence, cigarettes, uncomplicated: Secondary | ICD-10-CM | POA: Insufficient documentation

## 2017-10-07 DIAGNOSIS — H1031 Unspecified acute conjunctivitis, right eye: Secondary | ICD-10-CM | POA: Insufficient documentation

## 2017-10-07 DIAGNOSIS — H109 Unspecified conjunctivitis: Secondary | ICD-10-CM

## 2017-10-07 DIAGNOSIS — R0989 Other specified symptoms and signs involving the circulatory and respiratory systems: Secondary | ICD-10-CM | POA: Insufficient documentation

## 2017-10-07 MED ORDER — ERYTHROMYCIN 5 MG/GM OP OINT: TOPICAL_OINTMENT | OPHTHALMIC | 0 refills | Status: AC

## 2017-10-07 MED FILL — ERYTHROMYCIN EYE OINTMENT: 5 | 7 days supply | Qty: 4 | Fill #0

## 2017-10-07 NOTE — Discharge Instructions (Addendum)
Use erythromycin ointment in the right eye 3 times a day as directed.  Return for any new or worse symptoms.  Most likely this is a viral conjunctivitis.  As we discussed it could be allergy related.  If it starts to affect the other I could give a trial of Visine antihistamine drops.  Available over-the-counter.

## 2017-10-07 NOTE — ED Triage Notes (Signed)
R eye pain and redness since waking up this morning. Pt reports it was crusted shut when she woke up.

## 2017-10-07 NOTE — ED Provider Notes (Signed)
[facility] EMERGENCY DEPARTMENT Provider Note   CSN: [id number] Arrival date & time: 10/07/17  0755     History   Chief Complaint Chief Complaint  Patient presents with  . Eye Pain    HPI Nancy Harper is a 27 y.o. female.  Patient woke up this morning with crusting and irritation to the right eye.  No significant pain.  Patient does not wear contacts.  Has had a runny nose today.  Not sure whether it is allergy related or not.  Patient has not had allergy problems in many years.     Past Medical History:  Diagnosis Date  . Pelvic abscess in female 11/2013   Admitted at [facility], underwent IR drainage    Patient Active Problem List   Diagnosis Date Noted  . Adnexal mass   . Pelvic pain in female   . Right tubo-ovarian abscess   . Tubo-ovarian abscess 12/01/2014  . Bacterial vaginosis 06/10/2014  . Normocytic anemia 06/10/2014  . TOA (tubo-ovarian abscess); bilateral 06/09/2014    Past Surgical History:  Procedure Laterality Date  . VAGINAL SEPTUM RESECTION  2003   Dr. April Manson, [facility]     OB History    Gravida  0   Para  0   Term  0   Preterm  0   AB  0   Living  0     SAB  0   TAB  0   Ectopic  0   Multiple  0   Live Births               Home Medications    Prior to Admission medications   Medication Sig Start Date End Date Taking? Authorizing Provider  erythromycin ophthalmic ointment Place a 1/2 inch ribbon of ointment into the right lower eyelid TID. 10/07/17   Vanetta Mulders, MD    Family History No family history on file.  Social History Social History   Tobacco Use  . Smoking status: Current Some Day Smoker    Types: Cigarettes  . Smokeless tobacco: Never Used  Substance Use Topics  . Alcohol use: Yes    Alcohol/week: 0.6 oz    Types: 1 Glasses of wine per week  . Drug use: No     Allergies   Patient has no known allergies.   Review of Systems Review of Systems  Constitutional:  Negative for fever.  HENT: Positive for congestion.   Eyes: Positive for discharge and redness. Negative for pain.  Respiratory: Negative for shortness of breath.   Cardiovascular: Negative for chest pain.  Gastrointestinal: Negative for abdominal pain.  Genitourinary: Negative for dysuria.  Musculoskeletal: Negative for myalgias.  Skin: Negative for rash.  Neurological: Negative for headaches.  Hematological: Does not bruise/bleed easily.  Psychiatric/Behavioral: Negative for confusion.     Physical Exam Updated Vital Signs BP 132/85 (BP Location: Right Arm)   Pulse 70   Temp 98.7 F (37.1 C) (Oral)   Resp 16   Ht 1.626 m (5\' 4" )   Wt 83.9 kg (185 lb)   SpO2 100%   BMI 31.76 kg/m   Physical Exam  Constitutional: She is oriented to person, place, and time. She appears well-developed and well-nourished. No distress.  HENT:  Head: Normocephalic and atraumatic.  Mouth/Throat: Oropharynx is clear and moist.  Eyes: Pupils are equal, round, and reactive to light. Conjunctivae and EOM are normal. Right eye exhibits no discharge. Left eye exhibits no discharge.  Some redness to both  conjunctiva.  But right greater than left.  No discharge.  Cornea normal.  No foreign body.  Neck: Normal range of motion. Neck supple.  Cardiovascular: Normal rate, regular rhythm and normal heart sounds.  Pulmonary/Chest: Effort normal and breath sounds normal.  Abdominal: Soft. Bowel sounds are normal.  Musculoskeletal: Normal range of motion.  Neurological: She is alert and oriented to person, place, and time. No cranial nerve deficit or sensory deficit. She exhibits normal muscle tone. Coordination normal.  Nursing note and vitals reviewed.    ED Treatments / Results  Labs (all labs ordered are listed, but only abnormal results are displayed) Labs Reviewed - No data to display  EKG None  Radiology No results found.  Procedures Procedures (including critical care time)  Medications  Ordered in ED Medications - No data to display   Initial Impression / Assessment and Plan / ED Course  I have reviewed the triage vital signs and the nursing notes.  Pertinent labs & imaging results that were available during my care of the patient were reviewed by me and considered in my medical decision making (see chart for details).     Suspect symptoms are more related to a viral conjunctivitis.  Allergic conjunctivitis is a possibility.  Discussed with patient.  Will treat with erythromycin eye ointment.  Patient does not wear contacts.  No real eye pain.  No foreign body.       Final Clinical Impressions(s) / ED Diagnoses   Final diagnoses:  Conjunctivitis of right eye, unspecified conjunctivitis type    ED Discharge Orders        Ordered    erythromycin ophthalmic ointment     10/07/17 0831       Vanetta MuldersZackowski, Ronak Duquette, MD 10/07/17 [phone]

## 2018-01-04 ENCOUNTER — Other Ambulatory Visit: Payer: Self-pay

## 2018-01-04 ENCOUNTER — Emergency Department (HOSPITAL_BASED_OUTPATIENT_CLINIC_OR_DEPARTMENT_OTHER)
Admission: EM | Admit: 2018-01-04 | Discharge: 2018-01-04 | Disposition: A | Discharge status: Home / Self Care | Payer: Self-pay | Attending: Emergency Medicine | Admitting: Emergency Medicine

## 2018-01-04 ENCOUNTER — Encounter (HOSPITAL_BASED_OUTPATIENT_CLINIC_OR_DEPARTMENT_OTHER): Payer: Self-pay

## 2018-01-04 DIAGNOSIS — F17210 Nicotine dependence, cigarettes, uncomplicated: Secondary | ICD-10-CM | POA: Insufficient documentation

## 2018-01-04 DIAGNOSIS — R519 Headache, unspecified: Secondary | ICD-10-CM

## 2018-01-04 DIAGNOSIS — R51 Headache: Secondary | ICD-10-CM | POA: Insufficient documentation

## 2018-01-04 MED ORDER — SODIUM CHLORIDE 0.9 % IV BOLUS: 1000.0000 mL | Freq: Once | INTRAVENOUS | Status: DC

## 2018-01-04 NOTE — ED Provider Notes (Signed)
[facility] EMERGENCY DEPARTMENT Provider Note   CSN: [id number] Arrival date & time: 01/04/18  1832     History   Chief Complaint Chief Complaint  Patient presents with  . Headache    HPI Nancy Harper is a 27 y.o. female who presents today for evaluation of a headache.  She reports that her headache started last night, is generally across the front of her face.  She denies any blurry vision, no nausea or vomiting.  She denies any trauma.  She has a history of headaches which she calls migraines.  She reports her pain is currently a 5 out of 10, normally her headaches are only a 3 out of 10.  She denies any prodromal symptoms.  No fevers or chills.  She denies any weakness, numbness, or tingling.  HPI  Past Medical History:  Diagnosis Date  . Pelvic abscess in female 11/2013   Admitted at [facility], underwent IR drainage    Patient Active Problem List   Diagnosis Date Noted  . Adnexal mass   . Pelvic pain in female   . Right tubo-ovarian abscess   . Tubo-ovarian abscess 12/01/2014  . Bacterial vaginosis 06/10/2014  . Normocytic anemia 06/10/2014  . TOA (tubo-ovarian abscess); bilateral 06/09/2014    Past Surgical History:  Procedure Laterality Date  . VAGINAL SEPTUM RESECTION  2003   Dr. April Manson, [facility]     OB History    Gravida  0   Para  0   Term  0   Preterm  0   AB  0   Living  0     SAB  0   TAB  0   Ectopic  0   Multiple  0   Live Births               Home Medications    Prior to Admission medications   Medication Sig Start Date End Date Taking? Authorizing Provider  erythromycin ophthalmic ointment Place a 1/2 inch ribbon of ointment into the right lower eyelid TID. 10/07/17   Vanetta Mulders, MD    Family History No family history on file.  Social History Social History   Tobacco Use  . Smoking status: Current Some Day Smoker    Types: Cigarettes  . Smokeless tobacco: Never Used  Substance Use  Topics  . Alcohol use: Yes    Comment: occ  . Drug use: No     Allergies   Patient has no known allergies.   Review of Systems Review of Systems  Constitutional: Negative for chills and fever.  Eyes: Negative for pain and visual disturbance.  Gastrointestinal: Negative for nausea and vomiting.  Musculoskeletal: Negative for arthralgias, back pain and neck pain.  Skin: Negative for color change and rash.  Neurological: Positive for headaches. Negative for syncope and weakness.  All other systems reviewed and are negative.    Physical Exam Updated Vital Signs BP 140/75 (BP Location: Left Arm)   Pulse 82   Temp 98.1 F (36.7 C) (Oral)   Resp 18   Ht 5\' 2"  (1.575 m)   Wt 83.9 kg (185 lb)   SpO2 100%   BMI 33.84 kg/m   Physical Exam  Constitutional: She appears well-developed and well-nourished. No distress.  HENT:  Head: Normocephalic and atraumatic.  Eyes: Pupils are equal, round, and reactive to light. Conjunctivae and EOM are normal. Right eye exhibits no discharge. Left eye exhibits no discharge. No scleral icterus.  Neck: Normal range  of motion. Neck supple.  Cardiovascular: Normal rate and regular rhythm.  Pulmonary/Chest: Effort normal. No stridor. No respiratory distress.  Abdominal: She exhibits no distension.  Musculoskeletal: She exhibits no edema or deformity.  Neurological: She is alert. She has normal strength. No cranial nerve deficit or sensory deficit. She exhibits normal muscle tone. GCS eye subscore is 4. GCS verbal subscore is 5. GCS motor subscore is 6.  Skin: Skin is warm and dry. She is not diaphoretic.  Psychiatric: She has a normal mood and affect. Her behavior is normal.  Nursing note and vitals reviewed.    ED Treatments / Results  Labs (all labs ordered are listed, but only abnormal results are displayed) Labs Reviewed - No data to display  EKG None  Radiology No results found.  Procedures Procedures (including critical care  time)  Medications Ordered in ED Medications - No data to display   Initial Impression / Assessment and Plan / ED Course  I have reviewed the triage vital signs and the nursing notes.  Pertinent labs & imaging results that were available during my care of the patient were reviewed by me and considered in my medical decision making (see chart for details).    Patient presents today for evaluation of a headache.  She is neurologically intact.  Discussed possible treatment options with patient, including fluids, IV medication, and evaluation for improvement, however patient states that she only has one ride who has to leave and therefore cannot stay for that.  She declined further treatment, work-up, or evaluation.  She stated that she would try over-the-counter Excedrin, and if that does not improve then she would seek additional medical care and evaluation.  Return precautions were discussed.  Patient discharged home.  Final Clinical Impressions(s) / ED Diagnoses   Final diagnoses:  Acute nonintractable headache, unspecified headache type    ED Discharge Orders    None       Norman ClayHammond, Elowyn Raupp W, PA-C 01/05/18 0011    Arby BarrettePfeiffer, Marcy, MD 01/14/18 1859

## 2018-01-04 NOTE — ED Notes (Signed)
EDP informed this RN patient does not want fluids at this time.

## 2018-01-04 NOTE — Discharge Instructions (Addendum)
Please make sure that you are staying well-hydrated, and increase your fluids.  I recommend getting Excedrin Migraine at the drugstore and giving that a try.  If you have any concerns or this does not improve your headache please follow-up with your primary doctor or return to the emergency room when you will be able to spend a couple hours getting treatment.

## 2018-01-04 NOTE — ED Triage Notes (Signed)
C/o HA started yesterday-denies head injuyr-NAD-steady gait

## 2018-02-08 ENCOUNTER — Other Ambulatory Visit: Payer: Self-pay

## 2018-02-08 ENCOUNTER — Emergency Department (HOSPITAL_BASED_OUTPATIENT_CLINIC_OR_DEPARTMENT_OTHER)
Admission: EM | Admit: 2018-02-08 | Discharge: 2018-02-08 | Disposition: A | Discharge status: Home / Self Care | Payer: Self-pay | Attending: Emergency Medicine | Admitting: Emergency Medicine

## 2018-02-08 ENCOUNTER — Encounter (HOSPITAL_BASED_OUTPATIENT_CLINIC_OR_DEPARTMENT_OTHER): Payer: Self-pay | Admitting: *Deleted

## 2018-02-08 DIAGNOSIS — N390 Urinary tract infection, site not specified: Secondary | ICD-10-CM | POA: Insufficient documentation

## 2018-02-08 DIAGNOSIS — F17210 Nicotine dependence, cigarettes, uncomplicated: Secondary | ICD-10-CM | POA: Insufficient documentation

## 2018-02-08 DIAGNOSIS — Z79899 Other long term (current) drug therapy: Secondary | ICD-10-CM | POA: Insufficient documentation

## 2018-02-08 DIAGNOSIS — M545 Low back pain, unspecified: Secondary | ICD-10-CM

## 2018-02-08 LAB — URINALYSIS, MICROSCOPIC (REFLEX)

## 2018-02-08 LAB — URINALYSIS, ROUTINE W REFLEX MICROSCOPIC
Bilirubin Urine: NEGATIVE
GLUCOSE, UA: NEGATIVE mg/dL
Ketones, ur: NEGATIVE mg/dL
Nitrite: POSITIVE — AB
Protein, ur: NEGATIVE mg/dL
pH: 5.5 (ref 5.0–8.0)

## 2018-02-08 LAB — PREGNANCY, URINE: Preg Test, Ur: NEGATIVE

## 2018-02-08 MED ORDER — CEPHALEXIN 500 MG PO CAPS: 500.0000 mg | ORAL_CAPSULE | Freq: Four times a day (QID) | ORAL | 0 refills | Status: DC

## 2018-02-08 MED ORDER — CEPHALEXIN 250 MG PO CAPS
500.0000 mg | ORAL_CAPSULE | Freq: Once | ORAL | Status: AC
  Administered 2018-02-08: 500 mg via ORAL
  Filled 2018-02-08: qty 2

## 2018-02-08 NOTE — Discharge Instructions (Addendum)
You were evaluated in the emergency department for low back pain and urinary symptoms.  Your urine showed signs of an infection and we are starting you on antibiotics.  You can use Tylenol or ibuprofen for your back pain.  He should have improvement in your symptoms in the next few days.  If your pain is worsened you should return to the emergency department for further evaluation.

## 2018-02-08 NOTE — ED Notes (Signed)
ED Provider at bedside. 

## 2018-02-08 NOTE — ED Provider Notes (Signed)
[facility] EMERGENCY DEPARTMENT Provider Note   CSN: [id number] Arrival date & time: 02/08/18  [id number]     History   Chief Complaint Chief Complaint  Patient presents with  . Back Pain    HPI Nancy Harper is a 27 y.o. female.  She is complaining of low back pain that started yesterday.  Is a little bit of radiation to her right lateral abdomen.  She is also had 1 week of some discoloration in her urine and some urinary frequency.  There is no particular dysuria.  She denies fevers chills nausea vomiting.  No vaginal bleeding or discharge.  She had a UTI in the past and it feels something like that.  She has tried nothing for it.  The history is provided by the patient.  Back Pain   This is a new problem. The current episode started yesterday. The problem occurs constantly. The problem has not changed since onset.The pain is associated with no known injury. Pain location: lower back. The quality of the pain is described as aching. The pain does not radiate. The pain is moderate. Pertinent negatives include no chest pain, no fever, no numbness, no headaches, no abdominal pain, no abdominal swelling, no bowel incontinence, no perianal numbness, no bladder incontinence, no dysuria, no pelvic pain, no leg pain, no paresthesias, no tingling and no weakness. She has tried nothing for the symptoms. The treatment provided no relief.    Past Medical History:  Diagnosis Date  . Pelvic abscess in female 11/2013   [facility], underwent IR drainage    Patient Active Problem List   Diagnosis Date Noted  . Adnexal mass   . Pelvic pain in female   . Right tubo-ovarian abscess   . Tubo-ovarian abscess 12/01/2014  . Bacterial vaginosis 06/10/2014  . Normocytic anemia 06/10/2014  . TOA (tubo-ovarian abscess); bilateral 06/09/2014    Past Surgical History:  Procedure Laterality Date  . VAGINAL SEPTUM RESECTION  2003   [facility]     OB History    Gravida  0   Para  0   Term  0   Preterm  0   AB  0   Living  0     SAB  0   TAB  0   Ectopic  0   Multiple  0   Live Births               Home Medications    Prior to Admission medications   Medication Sig Start Date End Date Taking? Authorizing Provider  erythromycin ophthalmic ointment Place a 1/2 inch ribbon of ointment into the right lower eyelid TID. 10/07/17   Vanetta MuldersZackowski, Scott, MD    Family History No family history on file.  Social History Social History   Tobacco Use  . Smoking status: Current Some Day Smoker    Types: Cigarettes  . Smokeless tobacco: Never Used  Substance Use Topics  . Alcohol use: Yes    Comment: occ  . Drug use: No     Allergies   Patient has no known allergies.   Review of Systems Review of Systems  Constitutional: Negative for fever.  HENT: Negative for sore throat.   Eyes: Negative for visual disturbance.  Respiratory: Negative for shortness of breath.   Cardiovascular: Negative for chest pain.  Gastrointestinal: Negative for abdominal pain and bowel incontinence.  Genitourinary: Positive for frequency. Negative for bladder incontinence, dysuria, pelvic pain, vaginal bleeding and vaginal discharge.  Musculoskeletal: Positive for back pain.  Skin: Negative for rash.  Neurological: Negative for tingling, weakness, numbness, headaches and paresthesias.     Physical Exam Updated Vital Signs BP 131/81   Pulse 82   Temp 98.2 F (36.8 C) (Oral)   Resp 20   Ht 5\' 4"  (1.626 m)   Wt 83.9 kg (185 lb)   SpO2 99%   BMI 31.76 kg/m   Physical Exam  Constitutional: She appears well-developed and well-nourished.  HENT:  Head: Normocephalic and atraumatic.  Eyes: Conjunctivae are normal.  Neck: Neck supple.  Cardiovascular: Normal rate, regular rhythm, normal heart sounds and intact distal pulses.  Pulmonary/Chest: Effort normal. No stridor. No respiratory distress. She has no wheezes. She has no rales.    Abdominal: Soft. She exhibits no mass. There is no tenderness. There is no guarding and no CVA tenderness.  Musculoskeletal: Normal range of motion. She exhibits no tenderness or deformity.  Neurological: She is alert. GCS eye subscore is 4. GCS verbal subscore is 5. GCS motor subscore is 6.  Skin: Skin is warm and dry.  Psychiatric: She has a normal mood and affect.  Nursing note and vitals reviewed.    ED Treatments / Results  Labs (all labs ordered are listed, but only abnormal results are displayed) Labs Reviewed  URINALYSIS, ROUTINE W REFLEX MICROSCOPIC - Abnormal; Notable for the following components:      Result Value   APPearance CLOUDY (*)    Specific Gravity, Urine >1.030 (*)    Hgb urine dipstick LARGE (*)    Nitrite POSITIVE (*)    Leukocytes, UA TRACE (*)    All other components within normal limits  URINALYSIS, MICROSCOPIC (REFLEX) - Abnormal; Notable for the following components:   Bacteria, UA MANY (*)    All other components within normal limits  PREGNANCY, URINE    EKG None  Radiology No results found.  Procedures Procedures (including critical care time)  Medications Ordered in ED Medications - No data to display   Initial Impression / Assessment and Plan / ED Course  I have reviewed the triage vital signs and the nursing notes.  Pertinent labs & imaging results that were available during my care of the patient were reviewed by me and considered in my medical decision making (see chart for details).  Clinical Course as of Feb 09 1009  Mon Feb 08, 2018  2016 Patient with low back pain and dark urine with some frequency.  Urine is positive with 6-10 whites 21-50 reds nitrite positive.  Her pain does not seem colicky in nature and I do not suspect a stone.  We will treat her with antibiotics and have her return if any worsening symptoms.   [MB]    Clinical Course User Index [MB] Terrilee Files, MD     Final Clinical Impressions(s) / ED  Diagnoses   Final diagnoses:  Acute bilateral low back pain without sciatica  Urinary tract infection in female    ED Discharge Orders        Ordered    cephALEXin (KEFLEX) 500 MG capsule  4 times daily     02/08/18 2018       Terrilee Files, MD 02/09/18 1010

## 2018-02-08 NOTE — ED Triage Notes (Signed)
Lower back pain. Dark urine with an odor.

## 2018-09-27 ENCOUNTER — Emergency Department (HOSPITAL_BASED_OUTPATIENT_CLINIC_OR_DEPARTMENT_OTHER): Payer: Self-pay

## 2018-09-27 ENCOUNTER — Emergency Department (HOSPITAL_BASED_OUTPATIENT_CLINIC_OR_DEPARTMENT_OTHER)
Admission: EM | Admit: 2018-09-27 | Discharge: 2018-09-27 | Disposition: A | Discharge status: Home / Self Care | Payer: Self-pay | Attending: Emergency Medicine | Admitting: Emergency Medicine

## 2018-09-27 ENCOUNTER — Encounter (HOSPITAL_BASED_OUTPATIENT_CLINIC_OR_DEPARTMENT_OTHER): Payer: Self-pay | Admitting: Emergency Medicine

## 2018-09-27 ENCOUNTER — Other Ambulatory Visit: Payer: Self-pay

## 2018-09-27 DIAGNOSIS — N83201 Unspecified ovarian cyst, right side: Secondary | ICD-10-CM | POA: Insufficient documentation

## 2018-09-27 DIAGNOSIS — R1031 Right lower quadrant pain: Secondary | ICD-10-CM

## 2018-09-27 DIAGNOSIS — N839 Noninflammatory disorder of ovary, fallopian tube and broad ligament, unspecified: Secondary | ICD-10-CM | POA: Insufficient documentation

## 2018-09-27 DIAGNOSIS — F17210 Nicotine dependence, cigarettes, uncomplicated: Secondary | ICD-10-CM | POA: Insufficient documentation

## 2018-09-27 LAB — CBC WITH DIFFERENTIAL/PLATELET
Abs Immature Granulocytes: 0.03 10*3/uL (ref 0.00–0.07)
Basophils Absolute: 0.1 10*3/uL (ref 0.0–0.1)
Basophils Relative: 1 %
Eosinophils Absolute: 0.2 10*3/uL (ref 0.0–0.5)
Eosinophils Relative: 2 %
HCT: 37.8 % (ref 36.0–46.0)
Hemoglobin: 12.1 g/dL (ref 12.0–15.0)
Immature Granulocytes: 0 %
Lymphocytes Relative: 29 %
Lymphs Abs: 2.8 10*3/uL (ref 0.7–4.0)
MCH: 28 pg (ref 26.0–34.0)
MCHC: 32 g/dL (ref 30.0–36.0)
MCV: 87.5 fL (ref 80.0–100.0)
Monocytes Absolute: 0.6 10*3/uL (ref 0.1–1.0)
Monocytes Relative: 6 %
Neutro Abs: 6 10*3/uL (ref 1.7–7.7)
Neutrophils Relative %: 62 %
Platelets: 227 10*3/uL (ref 150–400)
RBC: 4.32 MIL/uL (ref 3.87–5.11)
RDW: 12.8 % (ref 11.5–15.5)
WBC: 9.7 10*3/uL (ref 4.0–10.5)
nRBC: 0 % (ref 0.0–0.2)

## 2018-09-27 LAB — COMPREHENSIVE METABOLIC PANEL
ALT: 22 U/L (ref 0–44)
AST: 22 U/L (ref 15–41)
Albumin: 3.5 g/dL (ref 3.5–5.0)
Alkaline Phosphatase: 86 U/L (ref 38–126)
Anion gap: 6 (ref 5–15)
BUN: 9 mg/dL (ref 6–20)
CO2: 24 mmol/L (ref 22–32)
Calcium: 8.6 mg/dL — ABNORMAL LOW (ref 8.9–10.3)
Chloride: 108 mmol/L (ref 98–111)
Creatinine, Ser: 0.6 mg/dL (ref 0.44–1.00)
GFR calc Af Amer: >60 mL/min (ref >60)
GFR calc non Af Amer: >60 mL/min (ref >60)
Glucose, Bld: 107 mg/dL — ABNORMAL HIGH (ref 70–99)
Potassium: 3.7 mmol/L (ref 3.5–5.1)
Sodium: 138 mmol/L (ref 135–145)
Total Bilirubin: 0.3 mg/dL (ref 0.3–1.2)
Total Protein: 7 g/dL (ref 6.5–8.1)

## 2018-09-27 LAB — URINALYSIS, MICROSCOPIC (REFLEX)

## 2018-09-27 LAB — URINALYSIS, ROUTINE W REFLEX MICROSCOPIC
Bilirubin Urine: NEGATIVE
Glucose, UA: NEGATIVE mg/dL
Ketones, ur: NEGATIVE mg/dL
Leukocytes,Ua: NEGATIVE
Nitrite: NEGATIVE
Protein, ur: NEGATIVE mg/dL
Specific Gravity, Urine: >1.03 — ABNORMAL HIGH (ref 1.005–1.030)
pH: 6 (ref 5.0–8.0)

## 2018-09-27 LAB — WET PREP, GENITAL
Sperm: NONE SEEN
Trich, Wet Prep: NONE SEEN
Yeast Wet Prep HPF POC: NONE SEEN

## 2018-09-27 LAB — PREGNANCY, URINE: Preg Test, Ur: NEGATIVE

## 2018-09-27 MED ORDER — DOXYCYCLINE HYCLATE 100 MG PO CAPS: 100.0000 mg | ORAL_CAPSULE | Freq: Two times a day (BID) | ORAL | 0 refills | Status: AC

## 2018-09-27 MED ORDER — CEFTRIAXONE SODIUM 250 MG IJ SOLR
250.0000 mg | Freq: Once | INTRAMUSCULAR | Status: AC
  Administered 2018-09-27: 250 mg via INTRAMUSCULAR
  Filled 2018-09-27: qty 250

## 2018-09-27 MED ORDER — METRONIDAZOLE 500 MG PO TABS: 500.0000 mg | ORAL_TABLET | Freq: Two times a day (BID) | ORAL | 0 refills | Status: DC

## 2018-09-27 MED ORDER — LIDOCAINE HCL (PF) 1 % IJ SOLN
INTRAMUSCULAR | Status: AC
  Administered 2018-09-27: 5 mL
  Filled 2018-09-27: qty 5

## 2018-09-27 NOTE — ED Provider Notes (Signed)
[facility] EMERGENCY DEPARTMENT Provider Note   CSN: [id number] Arrival date & time: 09/27/18  1029    History   Chief Complaint Chief Complaint  Patient presents with   Abdominal Pain    HPI Nancy Harper is a 28 y.o. female with history of TOA who presents with right lower quadrant pain for the past 4 days.  She describes it as a cramping that has been winks and waning.  She reports it feels the same as when she had a TOA in the past.  She reports some vaginal bleeding, however patient states she recently went off birth control and may be getting her menstrual cycle again.  Denies any fever, chest pain, shortness of breath, nausea, vomiting, urinary symptoms, abnormal vaginal discharge other than the bleeding.  She took ibuprofen which helped her pain.  She has had some cramping in her back as well.     HPI  Past Medical History:  Diagnosis Date   Pelvic abscess in female 11/2013   Admitted at [facility], underwent IR drainage    Patient Active Problem List   Diagnosis Date Noted   Adnexal mass    Pelvic pain in female    Right tubo-ovarian abscess    Tubo-ovarian abscess 12/01/2014   Bacterial vaginosis 06/10/2014   Normocytic anemia 06/10/2014   TOA (tubo-ovarian abscess); bilateral 06/09/2014    Past Surgical History:  Procedure Laterality Date   VAGINAL SEPTUM RESECTION  2003   Dr. April Manson, [facility]     OB History    Gravida  0   Para  0   Term  0   Preterm  0   AB  0   Living  0     SAB  0   TAB  0   Ectopic  0   Multiple  0   Live Births               Home Medications    Prior to Admission medications   Medication Sig Start Date End Date Taking? Authorizing Provider  cephALEXin (KEFLEX) 500 MG capsule Take 1 capsule (500 mg total) by mouth 4 (four) times daily. 02/08/18   Terrilee Files, MD  doxycycline (VIBRAMYCIN) 100 MG capsule Take 1 capsule (100 mg total) by mouth 2 (two) times daily. 09/27/18    Emi Holes, PA-C  erythromycin ophthalmic ointment Place a 1/2 inch ribbon of ointment into the right lower eyelid TID. 10/07/17   Vanetta Mulders, MD  metroNIDAZOLE (FLAGYL) 500 MG tablet Take 1 tablet (500 mg total) by mouth 2 (two) times daily. 09/27/18   Emi Holes, PA-C    Family History No family history on file.  Social History Social History   Tobacco Use   Smoking status: Current Some Day Smoker    Types: Cigarettes   Smokeless tobacco: Never Used   Tobacco comment: Black and milds  Substance Use Topics   Alcohol use: Yes    Comment: occ   Drug use: No     Allergies   Patient has no known allergies.   Review of Systems Review of Systems  Constitutional: Negative for chills and fever.  HENT: Negative for facial swelling and sore throat.   Respiratory: Negative for shortness of breath.   Cardiovascular: Negative for chest pain.  Gastrointestinal: Negative for abdominal pain, nausea and vomiting.  Genitourinary: Positive for pelvic pain and vaginal bleeding. Negative for dysuria and vaginal discharge.  Musculoskeletal: Negative for back pain.  Skin:  Negative for rash and wound.  Neurological: Negative for headaches.  Psychiatric/Behavioral: The patient is not nervous/anxious.      Physical Exam Updated Vital Signs BP (!) 136/95 (BP Location: Right Arm)    Pulse 72    Temp 98.4 F (36.9 C) (Oral)    Resp 16    Ht  (1.651 m)    Wt 89.8 kg    LMP 09/20/2018 (Exact Date)    SpO2 100%    BMI 32.95 kg/m   Physical Exam Vitals signs and nursing note reviewed.  Constitutional:      General: She is not in acute distress.    Appearance: She is well-developed. She is not diaphoretic.  HENT:     Head: Normocephalic and atraumatic.     Mouth/Throat:     Pharynx: No oropharyngeal exudate.  Eyes:     General: No scleral icterus.       Right eye: No discharge.        Left eye: No discharge.     Conjunctiva/sclera: Conjunctivae normal.      Pupils: Pupils are equal, round, and reactive to light.  Neck:     Musculoskeletal: Normal range of motion and neck supple.     Thyroid: No thyromegaly.  Cardiovascular:     Rate and Rhythm: Normal rate and regular rhythm.     Heart sounds: Normal heart sounds. No murmur. No friction rub. No gallop.   Pulmonary:     Effort: Pulmonary effort is normal. No respiratory distress.     Breath sounds: Normal breath sounds. No stridor. No wheezing or rales.  Abdominal:     General: Bowel sounds are normal. There is no distension.     Palpations: Abdomen is soft.     Tenderness: There is no abdominal tenderness. There is no guarding or rebound. Negative signs include McBurney's sign.  Genitourinary:    Vagina: Bleeding present.     Cervix: No cervical motion tenderness.     Uterus: Tender.      Adnexa:        Right: No tenderness.         Left: No tenderness.    Lymphadenopathy:     Cervical: No cervical adenopathy.  Skin:    General: Skin is warm and dry.     Coloration: Skin is not pale.     Findings: No rash.  Neurological:     Mental Status: She is alert.     Coordination: Coordination normal.      ED Treatments / Results  Labs (all labs ordered are listed, but only abnormal results are displayed) Labs Reviewed  WET PREP, GENITAL - Abnormal; Notable for the following components:      Result Value   Clue Cells Wet Prep HPF POC PRESENT (*)    WBC, Wet Prep HPF POC MANY (*)    All other components within normal limits  COMPREHENSIVE METABOLIC PANEL - Abnormal; Notable for the following components:   Glucose, Bld 107 (*)    Calcium 8.6 (*)    All other components within normal limits  URINALYSIS, ROUTINE W REFLEX MICROSCOPIC - Abnormal; Notable for the following components:   APPearance HAZY (*)    Specific Gravity, Urine >1.030 (*)    Hgb urine dipstick LARGE (*)    All other components within normal limits  URINALYSIS, MICROSCOPIC (REFLEX) - Abnormal; Notable for the  following components:   Bacteria, UA RARE (*)    All other components within normal limits  CBC WITH DIFFERENTIAL/PLATELET  PREGNANCY, URINE  GC/CHLAMYDIA PROBE AMP ([place]) NOT AT [facility]    EKG None  Radiology US Transvaginal Non-ob  Result Date: 09/27/2018 CLINICAL DATA:  RIGHT lower quadrant pain and cramping, history of tubo-ovarian abscess EXAM: TRANSABDOMINAL AND TRANSVAGINAL ULTRASOUND OF PELVIS DOPPLER ULTRASOUND OF OVARIES TECHNIQUE: Both transabdominal and transvaginal ultrasound examinations of the pelvis were performed. Transabdominal technique was performed for global imaging of the pelvis including uterus, ovaries, adnexal regions, and pelvic cul-de-sac. It was necessary to proceed with endovaginal exam following the transabdominal exam to visualize the adnexa. Color and duplex Doppler ultrasound was utilized to evaluate blood flow to the ovaries. COMPARISON:  CT abdomen and pelvis 12/02/2014, ultrasound pelvis 12/01/2014 FINDINGS: Uterus Measurements: 8.0 x 2.6 x 7.3 cm = volume: 78.4 mL. Bicornuate 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 leiomyoma 3.1 x 3.2 x 3.4 cm. Endometrium Thickness: 2 mm.  No endometrial fluid or focal abnormality Right ovary Measurements: 6.5 x 4.9 x 4.2 cm = volume: 69.7 mL. Heterogeneous isodose mass within RIGHT ovary 3.8 x 3.3 x 3.3 cm, lacking internal blood flow on color Doppler imaging question hemorrhagic cyst versus less likely hypovascular mass. Blood flow detected within remainder of RIGHT ovary on color Doppler imaging. Left ovary Measurements: 5.0 x 2.3 x 3.2 cm = volume: 19.8 mL. 6 mm shadowing echogenic focus within LEFT ovary likely small calcification. No additional mass. Blood flow present within LEFT ovary on color Doppler imaging. Pulsed Doppler evaluation of both ovaries demonstrates normal low-resistance arterial and venous waveforms. Other findings Trace free pelvic fluid. Hypoechoic somewhat tubular appearing  structure is seen adjacent to the posterior RIGHT ovary question segment of complicated fallopian tube dilatation, question complex hydrosalpinx versus pyosalpinx or hematosalpinx. Cystic structure in the LEFT adnexa is also identified, 2.2 cm diameter, favor fluid-filled segment of bowel with mucosal folds, less likely a minimally complicated LEFT adnexal cyst. 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 Tubular hypoechoic structure adjacent to RIGHT ovary question complicated hydrosalpinx versus pyosalpinx or hematosalpinx. No evidence of ovarian torsion. Complex RIGHT ovarian mass 3.8 cm diameter favor hemorrhagic cyst; short-interval follow up ultrasound in 6-12 weeks is recommended, preferably during the week following the patient's normal menses. Favor fluid-filled bowel loop in LEFT adnexa rather than cystic LEFT adnexal mass; can assess at time of follow-up sonography. Electronically Signed   By: Ulyses Southward M.D.   On: 09/27/2018 13:38   US Pelvis Complete  Result Date: 09/27/2018 CLINICAL DATA:  RIGHT lower quadrant pain and cramping, history of tubo-ovarian abscess EXAM: TRANSABDOMINAL AND TRANSVAGINAL ULTRASOUND OF PELVIS DOPPLER ULTRASOUND OF OVARIES TECHNIQUE: Both transabdominal and transvaginal ultrasound examinations of the pelvis were performed. Transabdominal technique was performed for global imaging of the pelvis including uterus, ovaries, adnexal regions, and pelvic cul-de-sac. It was necessary to proceed with endovaginal exam following the transabdominal exam to visualize the adnexa. Color and duplex Doppler ultrasound was utilized to evaluate blood flow to the ovaries. COMPARISON:  CT abdomen and pelvis 12/02/2014, ultrasound pelvis 12/01/2014 FINDINGS: Uterus Measurements: 8.0 x 2.6 x 7.3 cm = volume: 78.4 mL. Bicornuate 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 leiomyoma 3.1 x 3.2 x 3.4 cm. Endometrium  Thickness: 2 mm.  No endometrial fluid or focal abnormality Right ovary Measurements: 6.5 x 4.9 x 4.2 cm = volume: 69.7 mL. Heterogeneous isodose mass within RIGHT ovary 3.8 x 3.3 x 3.3 cm, lacking internal blood flow on color Doppler  imaging question hemorrhagic cyst versus less likely hypovascular mass. Blood flow detected within remainder of RIGHT ovary on color Doppler imaging. Left ovary Measurements: 5.0 x 2.3 x 3.2 cm = volume: 19.8 mL. 6 mm shadowing echogenic focus within LEFT ovary likely small calcification. No additional mass. Blood flow present within LEFT ovary on color Doppler imaging. Pulsed Doppler evaluation of both ovaries demonstrates normal low-resistance arterial and venous waveforms. Other findings Trace free pelvic fluid. Hypoechoic somewhat tubular appearing structure is seen adjacent to the posterior RIGHT ovary question segment of complicated fallopian tube dilatation, question complex hydrosalpinx versus pyosalpinx or hematosalpinx. Cystic structure in the LEFT adnexa is also identified, 2.2 cm diameter, favor fluid-filled segment of bowel with mucosal folds, less likely a minimally complicated LEFT adnexal cyst. 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 Tubular hypoechoic structure adjacent to RIGHT ovary question complicated hydrosalpinx versus pyosalpinx or hematosalpinx. No evidence of ovarian torsion. Complex RIGHT ovarian mass 3.8 cm diameter favor hemorrhagic cyst; short-interval follow up ultrasound in 6-12 weeks is recommended, preferably during the week following the patient's normal menses. Favor fluid-filled bowel loop in LEFT adnexa rather than cystic LEFT adnexal mass; can assess at time of follow-up sonography. Electronically Signed   By: Ulyses Southward M.D.   On: 09/27/2018 13:38   [place] Art/ven Flow Abd Pelv Doppler  Result Date: 09/27/2018 CLINICAL DATA:  RIGHT lower quadrant pain and cramping, history  of tubo-ovarian abscess EXAM: TRANSABDOMINAL AND TRANSVAGINAL ULTRASOUND OF PELVIS DOPPLER ULTRASOUND OF OVARIES TECHNIQUE: Both transabdominal and transvaginal ultrasound examinations of the pelvis were performed. Transabdominal technique was performed for global imaging of the pelvis including uterus, ovaries, adnexal regions, and pelvic cul-de-sac. It was necessary to proceed with endovaginal exam following the transabdominal exam to visualize the adnexa. Color and duplex Doppler ultrasound was utilized to evaluate blood flow to the ovaries. COMPARISON:  CT abdomen and pelvis 12/02/2014, ultrasound pelvis 12/01/2014 FINDINGS: Uterus Measurements: 8.0 x 2.6 x 7.3 cm = volume: 78.4 mL. Bicornuate 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 leiomyoma 3.1 x 3.2 x 3.4 cm. Endometrium Thickness: 2 mm.  No endometrial fluid or focal abnormality Right ovary Measurements: 6.5 x 4.9 x 4.2 cm = volume: 69.7 mL. Heterogeneous isodose mass within RIGHT ovary 3.8 x 3.3 x 3.3 cm, lacking internal blood flow on color Doppler imaging question hemorrhagic cyst versus less likely hypovascular mass. Blood flow detected within remainder of RIGHT ovary on color Doppler imaging. Left ovary Measurements: 5.0 x 2.3 x 3.2 cm = volume: 19.8 mL. 6 mm shadowing echogenic focus within LEFT ovary likely small calcification. No additional mass. Blood flow present within LEFT ovary on color Doppler imaging. Pulsed Doppler evaluation of both ovaries demonstrates normal low-resistance arterial and venous waveforms. Other findings Trace free pelvic fluid. Hypoechoic somewhat tubular appearing structure is seen adjacent to the posterior RIGHT ovary question segment of complicated fallopian tube dilatation, question complex hydrosalpinx versus pyosalpinx or hematosalpinx. Cystic structure in the LEFT adnexa is also identified, 2.2 cm diameter, favor fluid-filled segment of bowel with mucosal folds, less likely a minimally complicated  LEFT adnexal cyst. 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 Tubular hypoechoic structure adjacent to RIGHT ovary question complicated hydrosalpinx versus pyosalpinx or hematosalpinx. No evidence of ovarian torsion. Complex RIGHT ovarian mass 3.8 cm diameter favor hemorrhagic cyst; short-interval follow up ultrasound in 6-12 weeks is recommended, preferably during the week following the patient's  normal menses. Favor fluid-filled bowel loop in LEFT adnexa rather than cystic LEFT adnexal mass; can assess at time of follow-up sonography. Electronically Signed   By: Ulyses SouthwardMark  Boles M.D.   On: 09/27/2018 13:38    Procedures Procedures (including critical care time)  Medications Ordered in ED Medications  cefTRIAXone (ROCEPHIN) injection 250 mg (250 mg Intramuscular Given 09/27/18 1434)  lidocaine (PF) (XYLOCAINE) 1 % injection (5 mLs  Given 09/27/18 1434)     Initial Impression / Assessment and Plan / ED Course  I have reviewed the triage vital signs and the nursing notes.  Pertinent labs & imaging results that were available during my care of the patient were reviewed by me and considered in my medical decision making (see chart for details).        Patient presents with a 4-day history of right lower quadrant cramping that feels similar to previous TOA.  Pelvic ultrasound shows suspected bicornuate uterus with small fibroid at the right fundus; tubular hypoechoic structure adjacent to right ovary question complicated hydrosalpinx versus pyosalpinx or hematosalpinx; no ovarian torsion; complex right ovarian mass 3.8 cm in diameter fever hemorrhagic cyst.  I discussed these findings with OB/GYN on-call, Dr. Debroah LoopArnold, who advised treating the patient for PID with Rocephin and doxycycline.  Wet prep ultrasound clue cells, so we will add Flagyl.  GC/chlamydia sent and pending.  Given patient's nontoxic appearance, no leukocytosis, and  afebrile, low suspicion of TOA at this time.  Patient will be given follow-up to OB/GYN as soon as possible.  Return precautions discussed.  Patient understands and agrees with plan.  Patient vital stable throughout ED course and discharged in satisfactory condition. I discussed patient case with Dr. Patria Maneampos who guided the patient's management and agrees with plan.   Final Clinical Impressions(s) / ED Diagnoses   Final diagnoses:  RLQ abdominal pain  Cyst of right ovary  Fallopian tube disorder    ED Discharge Orders         Ordered    doxycycline (VIBRAMYCIN) 100 MG capsule  2 times daily     09/27/18 1416    metroNIDAZOLE (FLAGYL) 500 MG tablet  2 times daily     09/27/18 [address]1416           Onyx Schirmer M, Cordelia Poche-C 09/27/18 1623    Azalia Bilisampos, Kevin, MD 09/28/18 [phone]

## 2018-09-27 NOTE — ED Notes (Signed)
Patient transported to Ultrasound 

## 2018-09-27 NOTE — ED Triage Notes (Addendum)
Right side abd pain x3 days.  Sts she has hx of "Ovarian Abscesses" and she believes this is what it is. Sts she had a bottle of Keflex left from last summer that she did not take and she has been taking it for 2 weeks.

## 2018-09-27 NOTE — Discharge Instructions (Signed)
Take doxycycline and Flagyl until completed.  Do not take drink alcohol while taking Flagyl.  You can continue alternating ibuprofen and Tylenol as needed for your pain. Please follow-up with 1 of the OB/GYN clinics as soon as possible for further evaluation and treatment of your ultrasound finding today.  Please return to the emergency department if you develop any new or worsening symptoms including worsening pain, persistent fever over 100.4, or any other new or concerning symptoms.

## 2018-09-28 LAB — GC/CHLAMYDIA PROBE AMP (~~LOC~~) NOT AT ARMC
Chlamydia: NEGATIVE
Neisseria Gonorrhea: NEGATIVE

## 2019-01-10 ENCOUNTER — Other Ambulatory Visit: Payer: Self-pay

## 2019-01-10 ENCOUNTER — Emergency Department (HOSPITAL_BASED_OUTPATIENT_CLINIC_OR_DEPARTMENT_OTHER)
Admission: EM | Admit: 2019-01-10 | Discharge: 2019-01-10 | Disposition: A | Discharge status: Home / Self Care | Payer: Self-pay | Attending: Emergency Medicine | Admitting: Emergency Medicine

## 2019-01-10 ENCOUNTER — Encounter (HOSPITAL_BASED_OUTPATIENT_CLINIC_OR_DEPARTMENT_OTHER): Payer: Self-pay | Admitting: *Deleted

## 2019-01-10 DIAGNOSIS — R102 Pelvic and perineal pain: Secondary | ICD-10-CM | POA: Insufficient documentation

## 2019-01-10 DIAGNOSIS — F17210 Nicotine dependence, cigarettes, uncomplicated: Secondary | ICD-10-CM | POA: Insufficient documentation

## 2019-01-10 DIAGNOSIS — B9689 Other specified bacterial agents as the cause of diseases classified elsewhere: Secondary | ICD-10-CM

## 2019-01-10 DIAGNOSIS — N760 Acute vaginitis: Secondary | ICD-10-CM | POA: Insufficient documentation

## 2019-01-10 LAB — URINALYSIS, ROUTINE W REFLEX MICROSCOPIC
Bilirubin Urine: NEGATIVE
Glucose, UA: NEGATIVE mg/dL
Ketones, ur: NEGATIVE mg/dL
Leukocytes,Ua: NEGATIVE
Nitrite: NEGATIVE
Protein, ur: NEGATIVE mg/dL
Specific Gravity, Urine: 1.02 (ref 1.005–1.030)
pH: 6 (ref 5.0–8.0)

## 2019-01-10 LAB — URINALYSIS, MICROSCOPIC (REFLEX)

## 2019-01-10 LAB — WET PREP, GENITAL
Sperm: NONE SEEN
Trich, Wet Prep: NONE SEEN
Yeast Wet Prep HPF POC: NONE SEEN

## 2019-01-10 LAB — PREGNANCY, URINE: Preg Test, Ur: NEGATIVE

## 2019-01-10 MED ORDER — KETOROLAC TROMETHAMINE 60 MG/2ML IM SOLN
60.0000 mg | Freq: Once | INTRAMUSCULAR | Status: AC
  Administered 2019-01-10: 60 mg via INTRAMUSCULAR
  Filled 2019-01-10: qty 2

## 2019-01-10 MED ORDER — METRONIDAZOLE 500 MG PO TABS: 500.0000 mg | ORAL_TABLET | Freq: Two times a day (BID) | ORAL | 0 refills | Status: AC

## 2019-01-10 NOTE — ED Triage Notes (Signed)
Lower abdominal pain. Her menses is lasting longer this cycle. Her birth control is not being regulated due to Covid and health department not being open.

## 2019-01-10 NOTE — ED Provider Notes (Signed)
[facility] EMERGENCY DEPARTMENT Provider Note   CSN: [id number] Arrival date & time: 01/10/19  1745     History   Chief Complaint Chief Complaint  Patient presents with  . Abdominal Pain    HPI Nancy Harper is a 28 y.o. female.     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  Patient states that she had been getting the Depo shot which had significantly helped with her pelvic pain with menses but since March she has not been able to get into the health department to get her new shot.  She started having her menses in March and every month the pain has become more severe around that time.  She describes it as a lower pelvic pain on the right and left side which even last 2 to 3 days after her menses.  She finished her menses 2 days ago but still has some mild brown discharge and spotting.  She has a fullness and pain in her pelvic region which she states feels like it has in the prior months it is more severe now.  She has no urinary symptoms.  She has had no other vaginal discharge and states she is sexually active with only one partner for the last 2 years.  No fever, nausea, vomiting, chest pain or diarrhea.  No known COVID contacts.  When she tried to call the health department they have still not able to see her and do a physical exam or give her the Depo shot.  The history is provided by the patient.  Abdominal Pain   Past Medical History:  Diagnosis Date  . Pelvic abscess in female 11/2013   [facility], [facility]    Patient Active Problem List   Diagnosis Date Noted  . Adnexal mass   . Pelvic pain in female   . Right tubo-ovarian abscess   . Tubo-ovarian abscess 12/01/2014  . Bacterial vaginosis 06/10/2014  . Normocytic anemia 06/10/2014  . TOA (tubo-ovarian abscess); bilateral 06/09/2014    Past  Surgical History:  Procedure Laterality Date  . VAGINAL SEPTUM RESECTION  2003   [facility]     OB History    Gravida  0   Para  0   Term  0   Preterm  0   AB  0   Living  0     SAB  0   TAB  0   Ectopic  0   Multiple  0   Live Births               Home Medications    Prior to Admission medications   Medication Sig Start Date End Date Taking? Authorizing Provider  cephALEXin (KEFLEX) 500 MG capsule Take 1 capsule (500 mg total) by mouth 4 (four) times daily. 02/08/18   Terrilee FilesButler, Michael C, MD  doxycycline (VIBRAMYCIN) 100 MG capsule Take 1 capsule (100 mg total) by mouth 2 (two) times daily. 09/27/18   Emi HolesLaw, Alexandra M, PA-C  erythromycin ophthalmic ointment Place a 1/2 inch ribbon of ointment into the right lower eyelid TID. 10/07/17   Vanetta MuldersZackowski, Scott, MD  metroNIDAZOLE (FLAGYL) 500 MG tablet Take 1 tablet (500 mg total) by mouth 2 (two) times daily. 01/10/19   Gwyneth SproutPlunkett, Buck Mcaffee, MD    Family History No family history on file.  Social  History Social History   Tobacco Use  . Smoking status: Current Some Day Smoker    Types: Cigarettes  . Smokeless tobacco: Never Used  . Tobacco comment: Black and milds  Substance Use Topics  . Alcohol use: Yes    Comment: occ  . Drug use: No     Allergies   Patient has no known allergies.   Review of Systems Review of Systems  Gastrointestinal: Positive for abdominal pain.  All other systems reviewed and are negative.    Physical Exam Updated Vital Signs BP 125/75 (BP Location: Right Arm)   Pulse 68   Temp 98.2 F (36.8 C) (Oral)   Resp 16   Ht 5\' 3"  (1.6 m)   Wt 88 kg   LMP 01/06/2019   SpO2 100%   BMI 34.37 kg/m   Physical Exam Vitals signs and nursing note reviewed.  Constitutional:      General: She is not in acute distress.    Appearance: She is well-developed.  HENT:     Head: Normocephalic and atraumatic.  Eyes:     Pupils: Pupils are equal, round, and  reactive to light.  Cardiovascular:     Rate and Rhythm: Normal rate and regular rhythm.     Heart sounds: Normal heart sounds. No murmur. No friction rub.  Pulmonary:     Effort: Pulmonary effort is normal.     Breath sounds: Normal breath sounds. No wheezing or rales.  Abdominal:     General: Bowel sounds are normal. There is no distension.     Palpations: Abdomen is soft.     Tenderness: There is abdominal tenderness in the right lower quadrant, suprapubic area and left lower quadrant. There is no right CVA tenderness, left CVA tenderness, guarding or rebound.  Genitourinary:    Cervix: No cervical motion tenderness or discharge.     Uterus: Enlarged.      Adnexa:        Right: No tenderness.         Left: No tenderness.       Comments: Scant dried blood in vaginal vault but no other acute abnormality.  Unable to visualize cervix but pt has had hx of surgery for bicornate uterus  Musculoskeletal: Normal range of motion.        General: No tenderness.     Comments: No edema  Skin:    General: Skin is warm and dry.     Findings: No rash.  Neurological:     Mental Status: She is alert and oriented to person, place, and time.     Cranial Nerves: No cranial nerve deficit.  Psychiatric:        Behavior: Behavior normal.      ED Treatments / Results  Labs (all labs ordered are listed, but only abnormal results are displayed) Labs Reviewed  WET PREP, GENITAL - Abnormal; Notable for the following components:      Result Value   Clue Cells Wet Prep HPF POC PRESENT (*)    WBC, Wet Prep HPF POC MANY (*)    All other components within normal limits  URINALYSIS, ROUTINE W REFLEX MICROSCOPIC - Abnormal; Notable for the following components:   Hgb urine dipstick LARGE (*)    All other components within normal limits  URINALYSIS, MICROSCOPIC (REFLEX) - Abnormal; Notable for the following components:   Bacteria, UA MANY (*)    All other components within normal limits  PREGNANCY,  URINE  GC/CHLAMYDIA PROBE AMP ([place])  NOT [facility]    EKG    Radiology No results found.  Procedures Procedures (including critical care time)  Medications Ordered in ED Medications  ketorolac (TORADOL) injection 60 mg (60 mg Intramuscular Given 01/10/19 1826)     Initial Impression / Assessment and Plan / ED Course  I have reviewed the triage vital signs and the nursing notes.  Pertinent labs & imaging results that were available during my care of the patient were reviewed by me and considered in my medical decision making (see chart for details).        Patient is a pleasant 28 year old female presenting today with lower abdominal and pelvic pain associated with menses.  She is now been off Depo-Provera since March because she cannot get into the health department to get the shot.  With every menses the pain is worsening.  This month her menses stopped 2 days ago and she still having some mild spotting but the pain continues in both the right and left pelvic region.  She denies any infectious symptoms, vaginal discharge or urinary complaints.  She is relatively low risk for STI as she has been sexually active with only one partner for the last 2 years.  However she does not use protection.  Patient was seen in March for similar symptoms and at that time had a pelvic ultrasound done that did show some abnormalities with her right adnexal cyst, fluid in her tube and a fibroid.  It was recommended that she get follow-up ultrasound in 6 to 12 weeks.  Ultrasound was reviewed with the patient and patient given the option to do the ultrasound now or follow-up with OB/GYN within the next few weeks and get the ultrasound done then.  Do not feel at this time it would significantly change patient's treatment today.  Wet prep is consistent with bacterial vaginosis but no other acute findings.  Urine without signs of infection and urine pregnancy test is negative.  Patient's vital signs are  within normal limits and low suspicion for tubo-ovarian abscess today.  Patient was cautioned that if she is unable to see OB/GYN or start running a fever and having worsening pain she will return for the ultrasound.  Final Clinical Impressions(s) / ED Diagnoses   Final diagnoses:  Pelvic pain in female  Bacterial vaginosis    ED Discharge Orders         Ordered    metroNIDAZOLE (FLAGYL) 500 MG tablet  2 times daily     01/10/19 1901           Gwyneth SproutPlunkett, Meet [place], MD 01/10/19 1925

## 2019-01-11 LAB — GC/CHLAMYDIA PROBE AMP (~~LOC~~) NOT AT ARMC
Chlamydia: NEGATIVE
Neisseria Gonorrhea: NEGATIVE

## 2019-01-12 ENCOUNTER — Encounter (HOSPITAL_COMMUNITY): Payer: Self-pay

## 2019-01-12 ENCOUNTER — Emergency Department (HOSPITAL_COMMUNITY)
Admission: EM | Admit: 2019-01-12 | Discharge: 2019-01-12 | Disposition: A | Discharge status: Home / Self Care | Payer: Self-pay | Attending: Emergency Medicine | Admitting: Emergency Medicine

## 2019-01-12 ENCOUNTER — Other Ambulatory Visit: Payer: Self-pay

## 2019-01-12 ENCOUNTER — Emergency Department (HOSPITAL_COMMUNITY): Payer: Self-pay

## 2019-01-12 DIAGNOSIS — N838 Other noninflammatory disorders of ovary, fallopian tube and broad ligament: Secondary | ICD-10-CM | POA: Insufficient documentation

## 2019-01-12 DIAGNOSIS — Z720 Tobacco use: Secondary | ICD-10-CM | POA: Insufficient documentation

## 2019-01-12 DIAGNOSIS — R110 Nausea: Secondary | ICD-10-CM | POA: Insufficient documentation

## 2019-01-12 DIAGNOSIS — R102 Pelvic and perineal pain: Secondary | ICD-10-CM

## 2019-01-12 LAB — CBC WITH DIFFERENTIAL/PLATELET
Abs Immature Granulocytes: 0.04 10*3/uL (ref 0.00–0.07)
Basophils Absolute: 0.1 10*3/uL (ref 0.0–0.1)
Basophils Relative: 1 %
Eosinophils Absolute: 0.4 10*3/uL (ref 0.0–0.5)
Eosinophils Relative: 4 %
HCT: 38.1 % (ref 36.0–46.0)
Hemoglobin: 12.3 g/dL (ref 12.0–15.0)
Immature Granulocytes: 0 %
Lymphocytes Relative: 28 %
Lymphs Abs: 3.4 10*3/uL (ref 0.7–4.0)
MCH: 28.4 pg (ref 26.0–34.0)
MCHC: 32.3 g/dL (ref 30.0–36.0)
MCV: 88 fL (ref 80.0–100.0)
Monocytes Absolute: 0.8 10*3/uL (ref 0.1–1.0)
Monocytes Relative: 6 %
Neutro Abs: 7.4 10*3/uL (ref 1.7–7.7)
Neutrophils Relative %: 61 %
Platelets: 302 10*3/uL (ref 150–400)
RBC: 4.33 MIL/uL (ref 3.87–5.11)
RDW: 12.2 % (ref 11.5–15.5)
WBC: 12.2 10*3/uL — ABNORMAL HIGH (ref 4.0–10.5)
nRBC: 0 % (ref 0.0–0.2)

## 2019-01-12 LAB — URINALYSIS, ROUTINE W REFLEX MICROSCOPIC
Bilirubin Urine: NEGATIVE
Glucose, UA: NEGATIVE mg/dL
Ketones, ur: NEGATIVE mg/dL
Nitrite: NEGATIVE
Protein, ur: NEGATIVE mg/dL
Specific Gravity, Urine: 1.005 (ref 1.005–1.030)
pH: 5 (ref 5.0–8.0)

## 2019-01-12 LAB — COMPREHENSIVE METABOLIC PANEL
ALT: 28 U/L (ref 0–44)
AST: 27 U/L (ref 15–41)
Albumin: 3.7 g/dL (ref 3.5–5.0)
Alkaline Phosphatase: 88 U/L (ref 38–126)
Anion gap: 11 (ref 5–15)
BUN: 8 mg/dL (ref 6–20)
CO2: 25 mmol/L (ref 22–32)
Calcium: 8.9 mg/dL (ref 8.9–10.3)
Chloride: 102 mmol/L (ref 98–111)
Creatinine, Ser: 0.84 mg/dL (ref 0.44–1.00)
GFR calc Af Amer: >60 mL/min (ref >60)
GFR calc non Af Amer: >60 mL/min (ref >60)
Glucose, Bld: 127 mg/dL — ABNORMAL HIGH (ref 70–99)
Potassium: 3.6 mmol/L (ref 3.5–5.1)
Sodium: 138 mmol/L (ref 135–145)
Total Bilirubin: 0.4 mg/dL (ref 0.3–1.2)
Total Protein: 7.3 g/dL (ref 6.5–8.1)

## 2019-01-12 LAB — LIPASE, BLOOD: Lipase: 26 U/L (ref 11–51)

## 2019-01-12 LAB — PREGNANCY, URINE: Preg Test, Ur: NEGATIVE

## 2019-01-12 MED ORDER — ONDANSETRON HCL 4 MG/2ML IJ SOLN
4.0000 mg | Freq: Once | INTRAMUSCULAR | Status: AC
  Administered 2019-01-12: 4 mg via INTRAVENOUS
  Filled 2019-01-12: qty 2

## 2019-01-12 MED ORDER — MORPHINE SULFATE (PF) 4 MG/ML IV SOLN
4.0000 mg | Freq: Once | INTRAVENOUS | Status: AC
  Administered 2019-01-12: 4 mg via INTRAVENOUS
  Filled 2019-01-12: qty 1

## 2019-01-12 NOTE — ED Notes (Signed)
Pt to [place] without pain or nausea.

## 2019-01-12 NOTE — ED Triage Notes (Signed)
Patient complains of RLQ abd. Pain since Sunday, states that she thinks its an ovarian cyst. Complains of light brown vaginal bleeding. Seen at [facility] yesterday and treated for BV

## 2019-01-12 NOTE — Discharge Instructions (Signed)
You were seen in the ED today for pelvic pain; your pelvic ultrasound showed a growing ovarian mass. Please keep appointment with Dr. Hazeline Junker next week for further evaluation; she will need to refer you to Gynecology Oncology. You can take Ibuprofen 600 mg q 6 hrs for pain.

## 2019-01-12 NOTE — ED Provider Notes (Signed)
[facility]ENC[facility] Provider Note   CSN: [id number] Arrival date & time: 01/12/19  0710    History   Chief Complaint No chief complaint on file.   HPI Shaaron Adlersha Coomes is a 28 y.o. female with PMHx bicornate uterus with septum resection in 2003, fibroids, uterine cysts, and bilateral TOA requiring drainage in the past who presents to the ED with worsening lower pelvic pain x 3 days. Per chart review, [facility] by Dr. Anitra LauthPlunkett for same symptoms and tested positive for BV. Pt last had an ultrasound done in March with findings of a ovarian cyst and fibroids; she was offered an ultrasound at the ED 2 days ago but opted to follow up with OBGYN instead. There was very little suspicion for TOA at that time and therefore the ultrasound did not change pt's course of treatment. Pt also endorses nausea without vomiting. She is sexually active with a partner for the past 2 years; awaiting GC chlamydia testing. She has been taking her Flagyl as prescribed. Pt denies any new vaginal discharge, itching, bleeding. She has not had intercourse since being seen in the ED. Denies fever, chills, vomiting, dysuria, frequency, diarrhea, constipation, or any other associated symptoms.        Past Medical History:  Diagnosis Date   Pelvic abscess in female 11/2013   [facility], underwent IR drainage    Patient Active Problem List   Diagnosis Date Noted   Adnexal mass    Pelvic pain in female    Right tubo-ovarian abscess    Tubo-ovarian abscess 12/01/2014   Bacterial vaginosis 06/10/2014   Normocytic anemia 06/10/2014   TOA (tubo-ovarian abscess); bilateral 06/09/2014    Past Surgical History:  Procedure Laterality Date   VAGINAL SEPTUM RESECTION  2003   [facility]     OB History    Gravida  0   Para  0   Term  0   Preterm  0   AB  0   Living  0     SAB  0   TAB  0   Ectopic  0   Multiple    0   Live Births               Home Medications    Prior to Admission medications   Medication Sig Start Date End Date Taking? Authorizing Provider  cephALEXin (KEFLEX) 500 MG capsule Take 1 capsule (500 mg total) by mouth 4 (four) times daily. 02/08/18   Terrilee FilesButler, Michael C, MD  doxycycline (VIBRAMYCIN) 100 MG capsule Take 1 capsule (100 mg total) by mouth 2 (two) times daily. 09/27/18   Emi HolesLaw, Alexandra M, PA-C  erythromycin ophthalmic ointment Place a 1/2 inch ribbon of ointment into the right lower eyelid TID. 10/07/17   Vanetta MuldersZackowski, Scott, MD  metroNIDAZOLE (FLAGYL) 500 MG tablet Take 1 tablet (500 mg total) by mouth 2 (two) times daily. 01/10/19   Gwyneth SproutPlunkett, Whitney, MD    Family History No family history on file.  Social History Social History   Tobacco Use   Smoking status: Current Some Day Smoker    Types: Cigarettes   Smokeless tobacco: Never Used   Tobacco comment: Black and milds  Substance Use Topics   Alcohol use: Yes    Comment: occ   Drug use: No     Allergies   Patient has no known allergies.   Review of Systems Review of Systems  Constitutional: Negative for chills and fever.  HENT: Negative for congestion.   Eyes: Negative for redness.  Respiratory: Negative for cough.   Cardiovascular: Negative for chest pain.  Gastrointestinal: Positive for abdominal pain and nausea. Negative for blood in stool, constipation, diarrhea and vomiting.  Genitourinary: Positive for pelvic pain. Negative for dysuria, frequency, menstrual problem, vaginal bleeding and vaginal discharge.  Musculoskeletal: Negative for myalgias.  Skin: Negative for rash.  Neurological: Negative for headaches.     Physical Exam Updated Vital Signs BP 115/85 (BP Location: Right Arm)    Pulse 71    Temp 97.6 F (36.4 C) (Oral)    Resp 16    LMP 01/06/2019    SpO2 100%   Physical Exam Vitals signs and nursing note reviewed.  Constitutional:      Appearance: She is not ill-appearing.   HENT:     Head: Normocephalic and atraumatic.  Eyes:     Conjunctiva/sclera: Conjunctivae normal.  Neck:     Musculoskeletal: Neck supple.  Cardiovascular:     Rate and Rhythm: Normal rate and regular rhythm.  Pulmonary:     Effort: Pulmonary effort is normal.     Breath sounds: Normal breath sounds.  Abdominal:     Palpations: Abdomen is soft.     Tenderness: There is abdominal tenderness. There is no right CVA tenderness, left CVA tenderness, guarding or rebound.     Comments: Soft, mild tenderness to lower abdomen bilaterally, +BS throughout, no r/g/r, neg murphy's, neg mcburney's, no CVA TTP   Skin:    General: Skin is warm and dry.  Neurological:     Mental Status: She is alert.      ED Treatments / Results  Labs (all labs ordered are listed, but only abnormal results are displayed) Labs Reviewed  COMPREHENSIVE METABOLIC PANEL - Abnormal; Notable for the following components:      Result Value   Glucose, Bld 127 (*)    All other components within normal limits  CBC WITH DIFFERENTIAL/PLATELET - Abnormal; Notable for the following components:   WBC 12.2 (*)    All other components within normal limits  URINALYSIS, ROUTINE W REFLEX MICROSCOPIC - Abnormal; Notable for the following components:   Color, Urine STRAW (*)    Hgb urine dipstick MODERATE (*)    Leukocytes,Ua TRACE (*)    Bacteria, UA RARE (*)    All other components within normal limits  LIPASE, BLOOD  PREGNANCY, URINE    EKG None  Radiology [place] Transvaginal Non-ob  Result Date: 01/12/2019 CLINICAL DATA:  Acute pelvic pain. EXAM: TRANSABDOMINAL AND TRANSVAGINAL ULTRASOUND OF PELVIS DOPPLER ULTRASOUND OF OVARIES TECHNIQUE: Both transabdominal and transvaginal ultrasound examinations of the pelvis were performed. Transabdominal technique was performed for global imaging of the pelvis including uterus, ovaries, adnexal regions, and pelvic cul-de-sac. It was necessary to proceed with endovaginal exam  following the transabdominal exam to visualize the ovaries and adnexal regions. Color and duplex Doppler ultrasound was utilized to evaluate blood flow to the ovaries. COMPARISON:  Ultrasound of September 27, 2018. FINDINGS: Uterus Measurements: 8.1 x 3.4 x 3.1 cm = volume: 77 mL. Bicornuate configuration of the uterus is noted. Endometrium Thickness: 7 mm.  No focal abnormality visualized. Right ovary Measurements: 10.4 x 11.2 x 9.0 cm = volume: 551 mL. Multi-septated predominantly cystic lesion measuring 10.1 x 9.9 x 7.4 cm is noted which is significantly enlarged compared to prior exam. Left ovary Measurements: 6.1 x 2.5 x 2.3 cm = volume: 18 mL. Stable  probable left hydrosalpinx is noted. 1.9 cm hypoechoic abnormality is noted which may represent hemorrhagic cyst. Pulsed Doppler evaluation of both ovaries demonstrates normal low-resistance arterial and venous waveforms. Other findings Trace free fluid is noted which most likely is physiologic. IMPRESSION: 10.1 x 9.9 x 7.4 cm multi-septated predominantly cystic lesion is seen arising from the right ovary which is significantly enlarged compared to prior exam. This is concerning for possible cystic ovarian neoplasm, and further evaluation with MRI is recommended. Stable left hydrosalpinx is noted. 1.9 cm hypoechoic abnormality is noted in left ovary which may represent hemorrhagic cyst. Short-interval follow up ultrasound in 6-12 weeks is recommended, preferably during the week following the patient's normal menses. Electronically Signed   By: Lupita RaiderJames  Green Jr M.D.   On: 01/12/2019 10:14   [place] Pelvis Complete  Result Date: 01/12/2019 CLINICAL DATA:  Acute pelvic pain. EXAM: TRANSABDOMINAL AND TRANSVAGINAL ULTRASOUND OF PELVIS DOPPLER ULTRASOUND OF OVARIES TECHNIQUE: Both transabdominal and transvaginal ultrasound examinations of the pelvis were performed. Transabdominal technique was performed for global imaging of the pelvis including uterus, ovaries, adnexal  regions, and pelvic cul-de-sac. It was necessary to proceed with endovaginal exam following the transabdominal exam to visualize the ovaries and adnexal regions. Color and duplex Doppler ultrasound was utilized to evaluate blood flow to the ovaries. COMPARISON:  Ultrasound of September 27, 2018. FINDINGS: Uterus Measurements: 8.1 x 3.4 x 3.1 cm = volume: 77 mL. Bicornuate configuration of the uterus is noted. Endometrium Thickness: 7 mm.  No focal abnormality visualized. Right ovary Measurements: 10.4 x 11.2 x 9.0 cm = volume: 551 mL. Multi-septated predominantly cystic lesion measuring 10.1 x 9.9 x 7.4 cm is noted which is significantly enlarged compared to prior exam. Left ovary Measurements: 6.1 x 2.5 x 2.3 cm = volume: 18 mL. Stable probable left hydrosalpinx is noted. 1.9 cm hypoechoic abnormality is noted which may represent hemorrhagic cyst. Pulsed Doppler evaluation of both ovaries demonstrates normal low-resistance arterial and venous waveforms. Other findings Trace free fluid is noted which most likely is physiologic. IMPRESSION: 10.1 x 9.9 x 7.4 cm multi-septated predominantly cystic lesion is seen arising from the right ovary which is significantly enlarged compared to prior exam. This is concerning for possible cystic ovarian neoplasm, and further evaluation with MRI is recommended. Stable left hydrosalpinx is noted. 1.9 cm hypoechoic abnormality is noted in left ovary which may represent hemorrhagic cyst. Short-interval follow up ultrasound in 6-12 weeks is recommended, preferably during the week following the patient's normal menses. Electronically Signed   By: Lupita RaiderJames  Green Jr M.D.   On: 01/12/2019 10:14   [place] Art/ven Flow Abd Pelv Doppler  Result Date: 01/12/2019 CLINICAL DATA:  Acute pelvic pain. EXAM: TRANSABDOMINAL AND TRANSVAGINAL ULTRASOUND OF PELVIS DOPPLER ULTRASOUND OF OVARIES TECHNIQUE: Both transabdominal and transvaginal ultrasound examinations of the pelvis were performed. Transabdominal  technique was performed for global imaging of the pelvis including uterus, ovaries, adnexal regions, and pelvic cul-de-sac. It was necessary to proceed with endovaginal exam following the transabdominal exam to visualize the ovaries and adnexal regions. Color and duplex Doppler ultrasound was utilized to evaluate blood flow to the ovaries. COMPARISON:  Ultrasound of September 27, 2018. FINDINGS: Uterus Measurements: 8.1 x 3.4 x 3.1 cm = volume: 77 mL. Bicornuate configuration of the uterus is noted. Endometrium Thickness: 7 mm.  No focal abnormality visualized. Right ovary Measurements: 10.4 x 11.2 x 9.0 cm = volume: 551 mL. Multi-septated predominantly cystic lesion measuring 10.1 x 9.9 x 7.4 cm is noted which is significantly  enlarged compared to prior exam. Left ovary Measurements: 6.1 x 2.5 x 2.3 cm = volume: 18 mL. Stable probable left hydrosalpinx is noted. 1.9 cm hypoechoic abnormality is noted which may represent hemorrhagic cyst. Pulsed Doppler evaluation of both ovaries demonstrates normal low-resistance arterial and venous waveforms. Other findings Trace free fluid is noted which most likely is physiologic. IMPRESSION: 10.1 x 9.9 x 7.4 cm multi-septated predominantly cystic lesion is seen arising from the right ovary which is significantly enlarged compared to prior exam. This is concerning for possible cystic ovarian neoplasm, and further evaluation with MRI is recommended. Stable left hydrosalpinx is noted. 1.9 cm hypoechoic abnormality is noted in left ovary which may represent hemorrhagic cyst. Short-interval follow up ultrasound in 6-12 weeks is recommended, preferably during the week following the patient's normal menses. Electronically Signed   By: Lupita Raider M.D.   On: 01/12/2019 10:14    Procedures Procedures (including critical care time)  Medications Ordered in ED Medications  ondansetron (ZOFRAN) injection 4 mg (4 mg Intravenous Given 01/12/19 0854)  morphine 4 MG/ML injection 4 mg (4  mg Intravenous Given 01/12/19 0854)     Initial Impression / Assessment and Plan / ED Course  I have reviewed the triage vital signs and the nursing notes.  Pertinent labs & imaging results that were available during my care of the patient were reviewed by me and considered in my medical decision making (see chart for details).  28 year old female presenting to the ED today with worsening pelvic pain, per chart review seen at [facility] 2 days ago for same and tested positive for BV. No pelvic ultrasound done at that time as pt opted to follow up with OBGYN instead; she has an appointment in 1 weeks time but could not wait due to worsening pain. Will order pelvic ultrasound today to rule out cyst rupture vs TOA vs torsion. If inconclusive may consider CT A/P as pt having lower abdominal pain as well as pelvic pain and this could represent an appendicitis. Pt does not want another pelvic exam done at this time; she had one done 2 days ago and denies any new vaginal discharge, bleeding, itching. She has not had intercourse since her pelvic exam and has been taking her Flagyl as prescribed. Will obtain baseline bloodwork today incase patient may need surgical intervention. Zofran and morphine given for symptomatic relief.   Clinical Course as of Jan 11 1125  Wed Jan 12, 2019  0912 Mildly elevated; questionable infection vs elevated due to pain  WBC(!): 12.2 [MV]  1026 Concern for neoplasm; MRI recommended  US Pelvis Complete [MV]  [phone]   [MV]  1050 Discussed case with pt's OBGYN Dr. Marcene Corning; she is unable to visualize the ultrasound images but can read report; suggests patient be referred to gyn oncology   [MV]  1055 Gyn Oncology consulted   [MV]  1105 Discussed case with OBGYN; they state patient typically needs to be evaluated by GYN first and then a referral sent to oncology   [MV]    Clinical Course User Index [MV] Tanda Rockers, PA-C   Unable to consult with gynecology oncology  today; spoke with OBGYN on call who states oncology typically will want patient to follow with OBGYN prior and then sent for referral. Dr. Marcene Corning had suggested earlier in the phone call that patient does not need emergent MRI today; will have her keep appointment for next Wednesday to receive referral for oncology. Patient advised to  take Ibuprofen PRN for pain and use heating pads for comfort. She is in agreement with plan at this time and stable for discharge home.        Final Clinical Impressions(s) / ED Diagnoses   Final diagnoses:  Pelvic pain  Ovarian mass, right    ED Discharge Orders    None       Eustaquio Maize, PA-C 01/12/19 1554    Charlesetta Shanks, MD 01/13/19 [phone]

## 2019-08-27 IMAGING — US US PELVIS COMPLETE
1 series · 13 of 25 positions shown · non-contrast
Comparison: CT abdomen and pelvis 12/02/2014, ultrasound pelvis
12/01/2014

CLINICAL DATA: RIGHT lower quadrant pain and cramping, history of
tubo-ovarian abscess

EXAM:
TRANSABDOMINAL AND TRANSVAGINAL ULTRASOUND OF PELVIS
DOPPLER ULTRASOUND OF OVARIES
TECHNIQUE: Both transabdominal and transvaginal ultrasound examinations of the
pelvis were performed. Transabdominal technique was performed for
global imaging of the pelvis including uterus, ovaries, adnexal
regions, and pelvic cul-de-sac.
It was necessary to proceed with endovaginal exam following the
transabdominal exam to visualize the adnexa. Color and duplex
Doppler ultrasound was utilized to evaluate blood flow to the
ovaries.

[Series 1: us pelvis complete · 173 acquisitions, 13 frames shown]
[im 1/173]
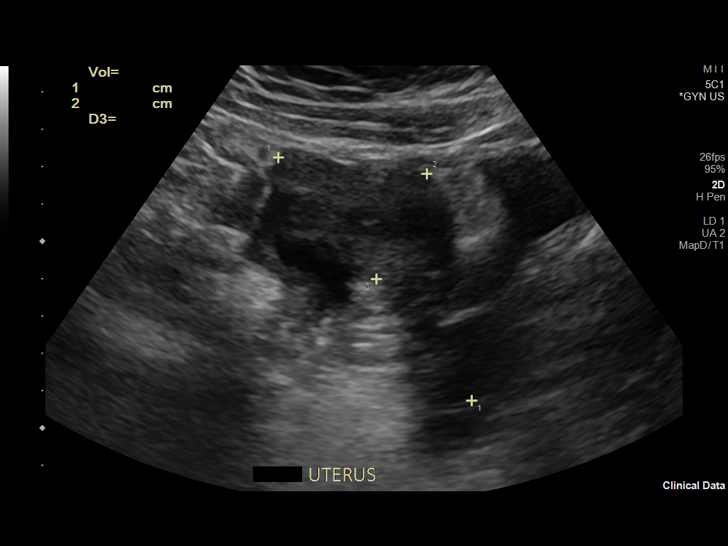
[im 15/173]
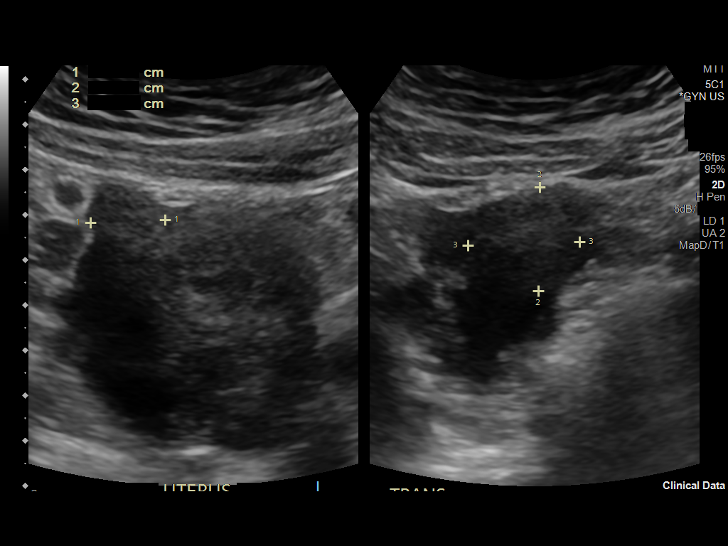
[im 29/173]
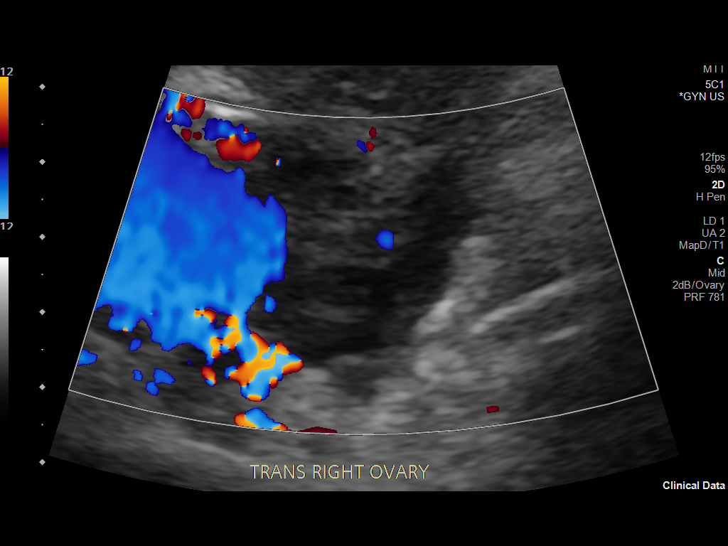
[im 44/173]
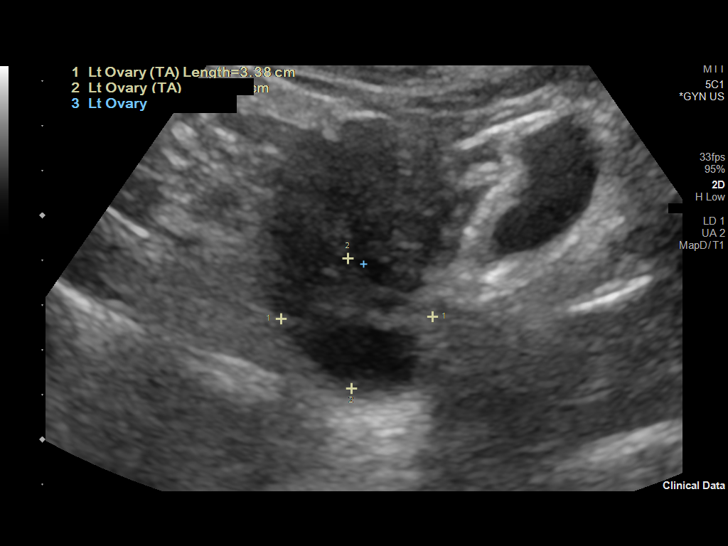
[im 58/173]
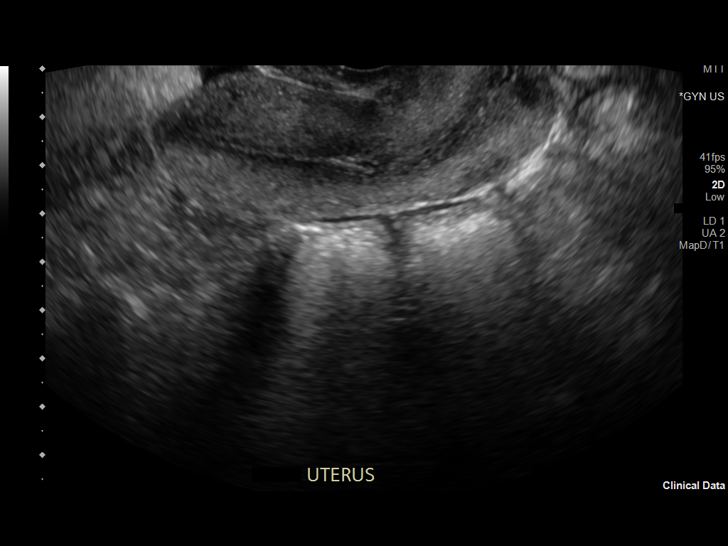
[im 72/173]
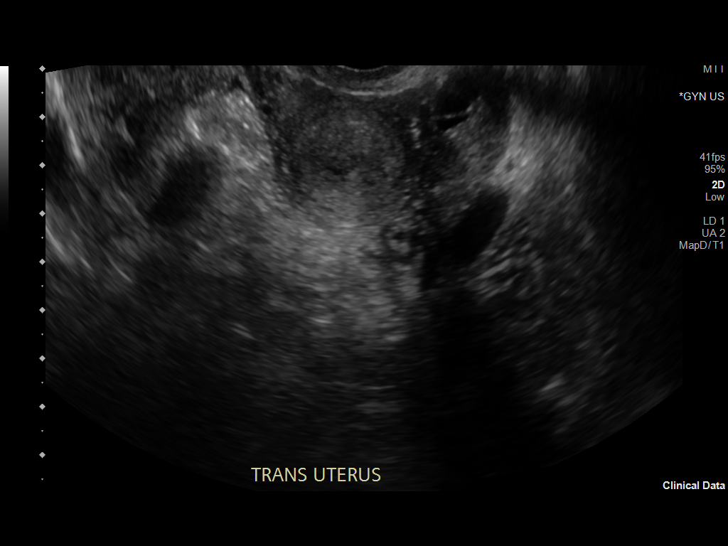
[im 87/173]
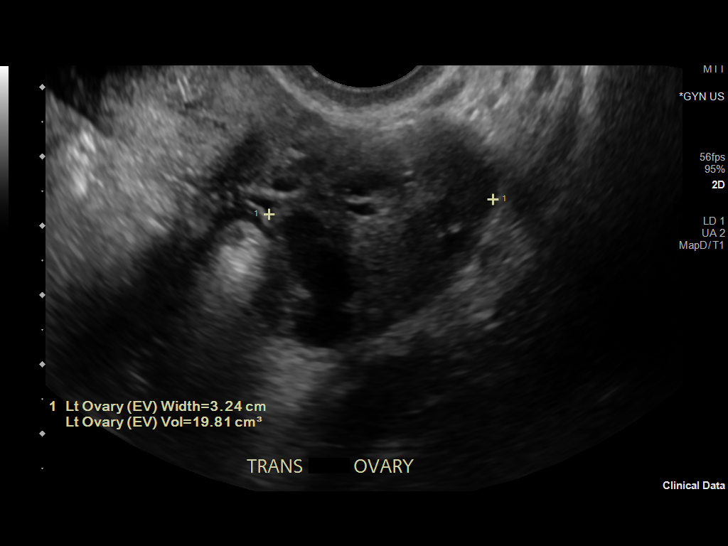
[im 101/173]
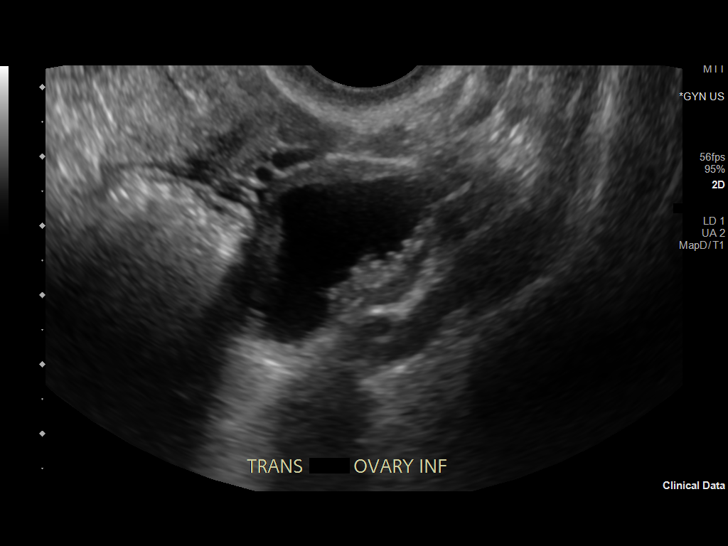
[im 115/173]
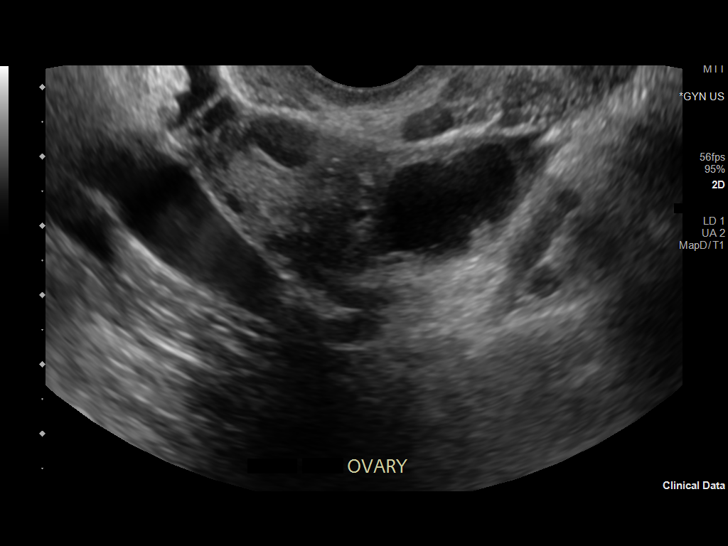
[im 130/173]
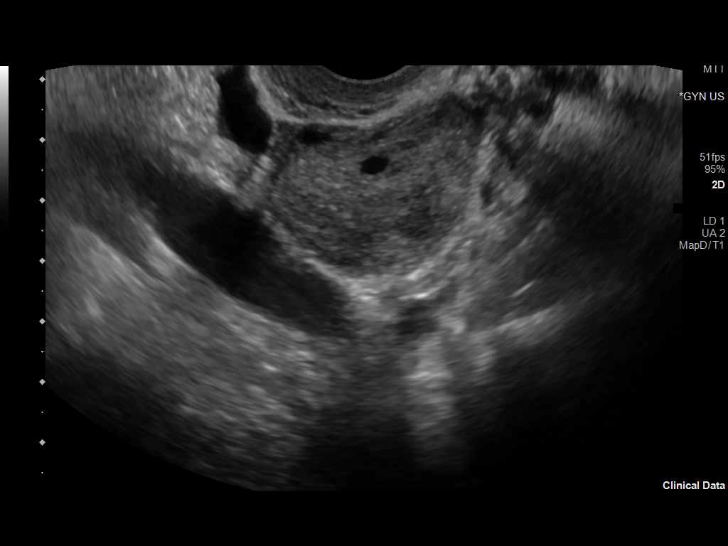
[im 144/173]
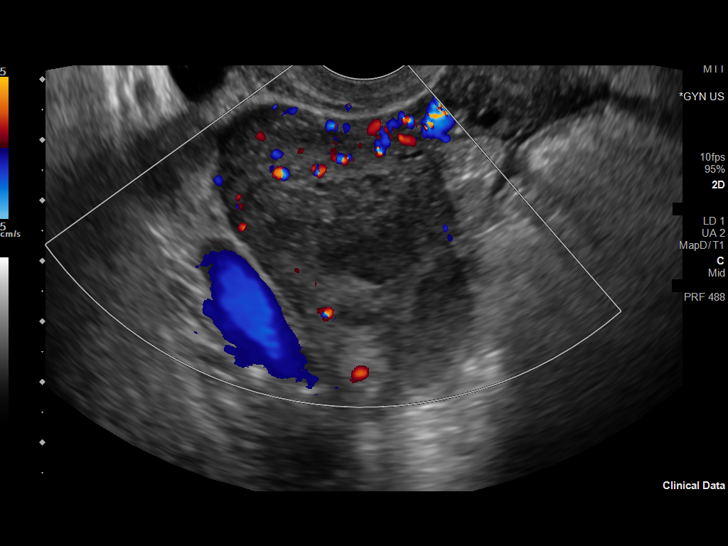
[im 158/173]
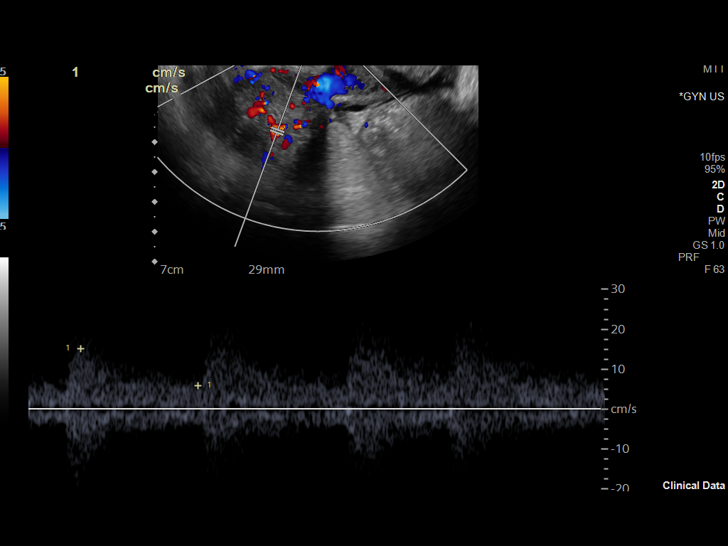
[im 173/173]
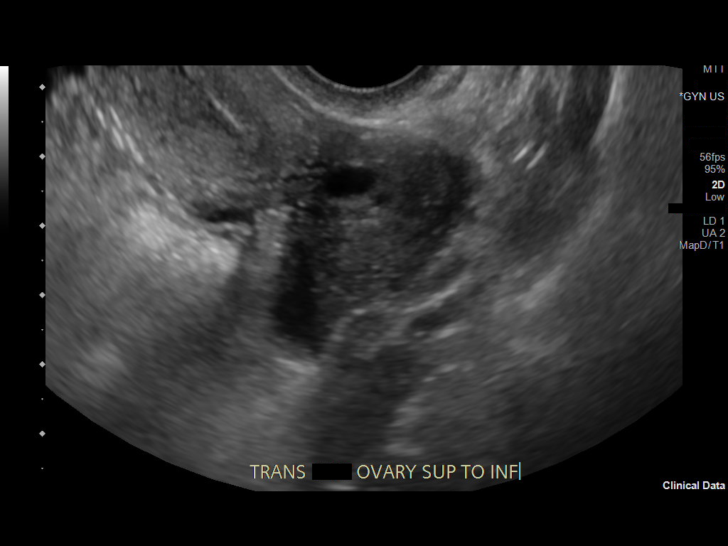

[13 of 25 positions shown; findings below may reference images not displayed]

FINDINGS: Uterus

Measurements: 8.0 x 2.6 x 7.3 cm = volume: 78.4 mL. Bicornuate
morphology. Mass identified at RIGHT fundus likely exophytic
leiomyoma 3.1 x 3.2 x 3.4 cm.

Endometrium

Thickness: 2 mm.  No endometrial fluid or focal abnormality

Right ovary

Measurements: 6.5 x 4.9 x 4.2 cm = volume: 69.7 mL. Heterogeneous
isodose mass within RIGHT ovary 3.8 x 3.3 x 3.3 cm, lacking internal
blood flow on color Doppler imaging question hemorrhagic cyst versus
less likely hypovascular mass. Blood flow detected within remainder
of RIGHT ovary on color Doppler imaging.

Left ovary

Measurements: 5.0 x 2.3 x 3.2 cm = volume: 19.8 mL. 6 mm shadowing
echogenic focus within LEFT ovary likely small calcification. No
additional mass. Blood flow present within LEFT ovary on color
Doppler imaging.

Pulsed Doppler evaluation of both ovaries demonstrates normal
low-resistance arterial and venous waveforms.

Other findings

Trace free pelvic fluid. Hypoechoic somewhat tubular appearing
structure is seen adjacent to the posterior RIGHT ovary question
segment of complicated fallopian tube dilatation, question complex
hydrosalpinx versus pyosalpinx or hematosalpinx. Cystic structure in
the LEFT adnexa is also identified, 2.2 cm diameter, favor
fluid-filled segment of bowel with mucosal folds, less likely a
minimally complicated LEFT adnexal cyst.
IMPRESSION: Suspected bicornuate uterine morphology with small uterine fibroid
at RIGHT fundus 3.1 cm diameter.

Tubular hypoechoic structure adjacent to RIGHT ovary question
complicated hydrosalpinx versus pyosalpinx or hematosalpinx.

No evidence of ovarian torsion.

Complex RIGHT ovarian mass 3.8 cm diameter favor hemorrhagic cyst;
short-interval follow up ultrasound in 6-12 weeks is recommended,
preferably during the week following the patient's normal menses.

Favor fluid-filled bowel loop in LEFT adnexa rather than cystic LEFT
adnexal mass; can assess at time of follow-up sonography.

## 2019-10-20 ENCOUNTER — Other Ambulatory Visit: Payer: Self-pay

## 2019-10-20 ENCOUNTER — Emergency Department (HOSPITAL_BASED_OUTPATIENT_CLINIC_OR_DEPARTMENT_OTHER)
Admission: EM | Admit: 2019-10-20 | Discharge: 2019-10-20 | Disposition: A | Discharge status: Home / Self Care | Payer: No Typology Code available for payment source | Attending: Emergency Medicine | Admitting: Emergency Medicine

## 2019-10-20 ENCOUNTER — Encounter (HOSPITAL_BASED_OUTPATIENT_CLINIC_OR_DEPARTMENT_OTHER): Payer: Self-pay | Admitting: Emergency Medicine

## 2019-10-20 DIAGNOSIS — M7918 Myalgia, other site: Secondary | ICD-10-CM

## 2019-10-20 DIAGNOSIS — Y93I9 Activity, other involving external motion: Secondary | ICD-10-CM | POA: Diagnosis not present

## 2019-10-20 DIAGNOSIS — F17210 Nicotine dependence, cigarettes, uncomplicated: Secondary | ICD-10-CM | POA: Diagnosis not present

## 2019-10-20 DIAGNOSIS — Z79899 Other long term (current) drug therapy: Secondary | ICD-10-CM | POA: Insufficient documentation

## 2019-10-20 DIAGNOSIS — Y92410 Unspecified street and highway as the place of occurrence of the external cause: Secondary | ICD-10-CM | POA: Diagnosis not present

## 2019-10-20 DIAGNOSIS — Y999 Unspecified external cause status: Secondary | ICD-10-CM | POA: Insufficient documentation

## 2019-10-20 DIAGNOSIS — M791 Myalgia, unspecified site: Secondary | ICD-10-CM | POA: Insufficient documentation

## 2019-10-20 LAB — PREGNANCY, URINE: Preg Test, Ur: NEGATIVE

## 2019-10-20 MED ORDER — DICLOFENAC SODIUM 1 % EX GEL: 2.0000 g | Freq: Four times a day (QID) | CUTANEOUS | 0 refills | Status: AC

## 2019-10-20 MED ORDER — CYCLOBENZAPRINE HCL 5 MG PO TABS: 5.0000 mg | ORAL_TABLET | Freq: Three times a day (TID) | ORAL | 0 refills | Status: AC | PRN

## 2019-10-20 MED FILL — CYCLOBENZAPRINE HCL 5 MG TA: 5 | 5 days supply | Qty: 15 | Fill #0

## 2019-10-20 MED FILL — VOLTAREN 1 % GEL: 1 | 7 days supply | Qty: 50 | Fill #0
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
## 2019-10-20 NOTE — Discharge Instructions (Addendum)
Please make sure to follow up with your PCP in 1 week. If worsening symptoms, weakness, loss of consciousness or worsening headache please follow up in the ED. Continue to use ibuprofen as needed. Use voltaren gel on the areas of pain on your body. You can also use flexeril as needed, it is a muscle relaxer so please do not drive after taking it as it may make you sleepy.

## 2019-12-12 IMAGING — US TRANSVAGINAL ULTRASOUND OF PELVIS
1 series · 13 of 25 positions shown · non-contrast
Comparison: Ultrasound September 27, 2018.

CLINICAL DATA: Acute pelvic pain.

EXAM:
TRANSABDOMINAL AND TRANSVAGINAL ULTRASOUND OF PELVIS
DOPPLER ULTRASOUND OF OVARIES
TECHNIQUE: Both transabdominal and transvaginal ultrasound examinations of the
pelvis were performed. Transabdominal technique was performed for
global imaging of the pelvis including uterus, ovaries, adnexal
regions, and pelvic cul-de-sac.
It was necessary to proceed with endovaginal exam following the
transabdominal exam to visualize the ovaries and adnexal regions.
Color and duplex Doppler ultrasound was utilized to evaluate blood
flow to the ovaries.

[Series 1: transvaginal ultrasound of pelvis · 88 acquisitions, 13 frames shown]
[im 1/88]
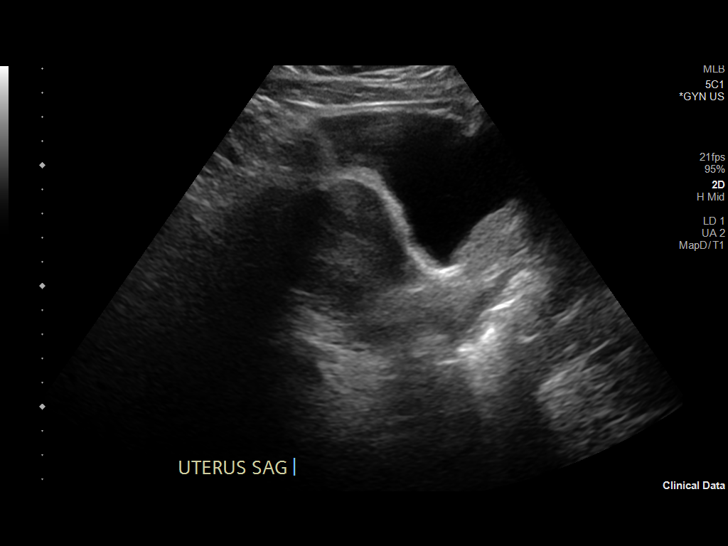
[im 8/88]
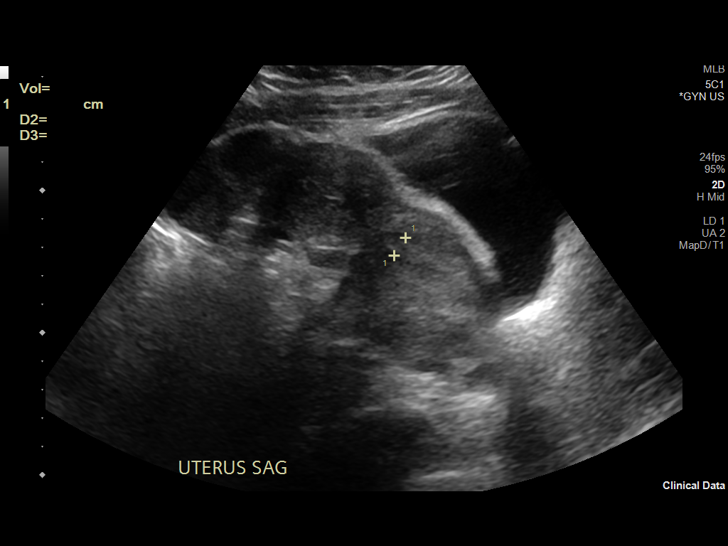
[im 15/88]
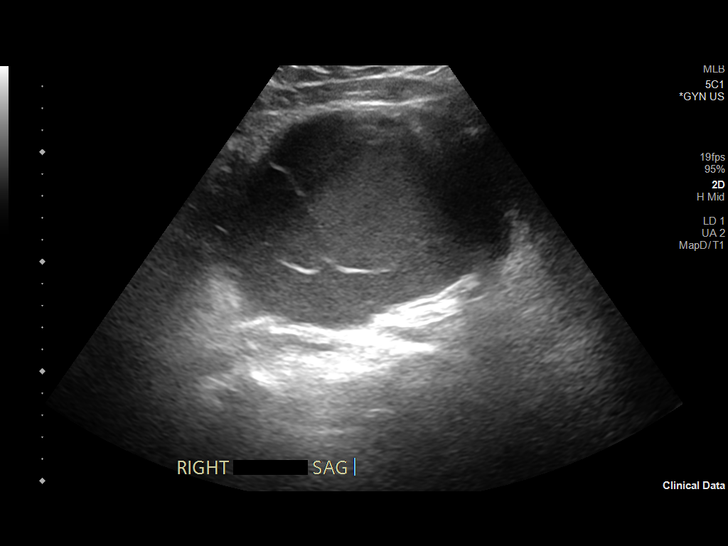
[im 22/88]
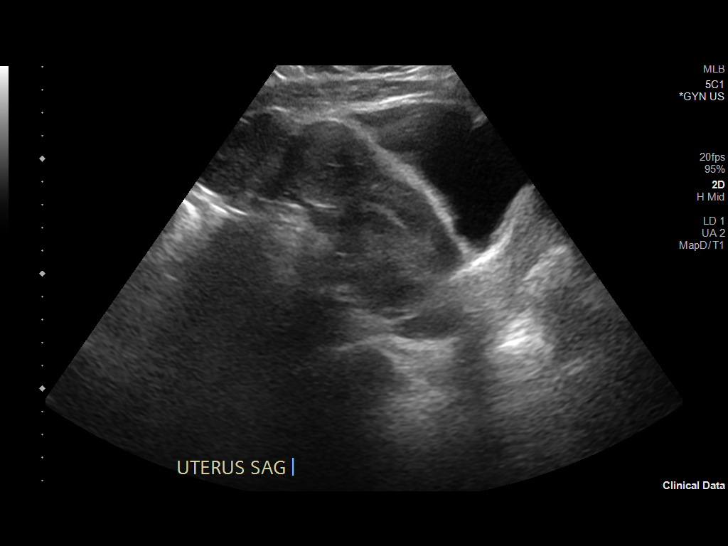
[im 30/88]
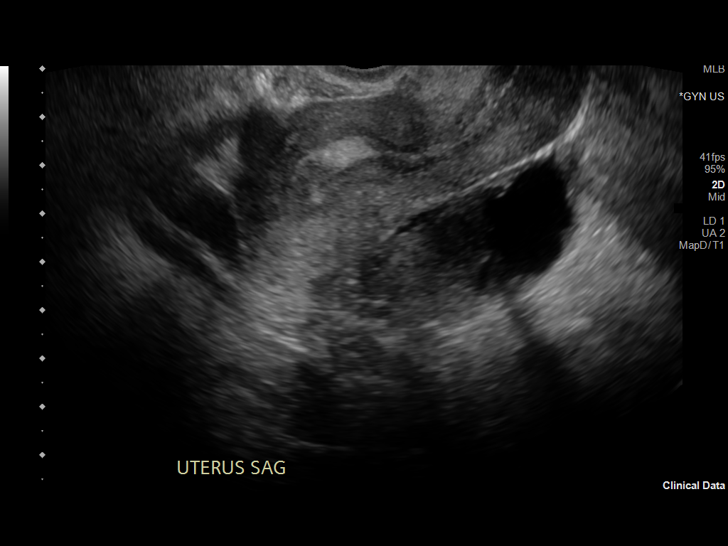
[im 37/88]
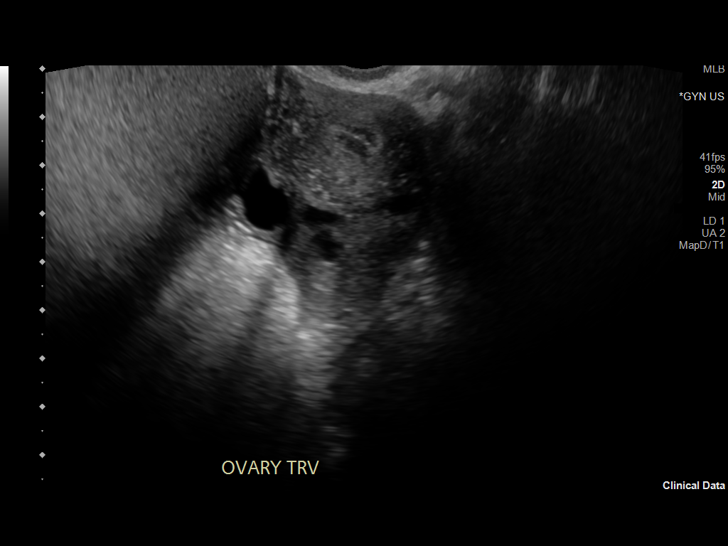
[im 44/88]
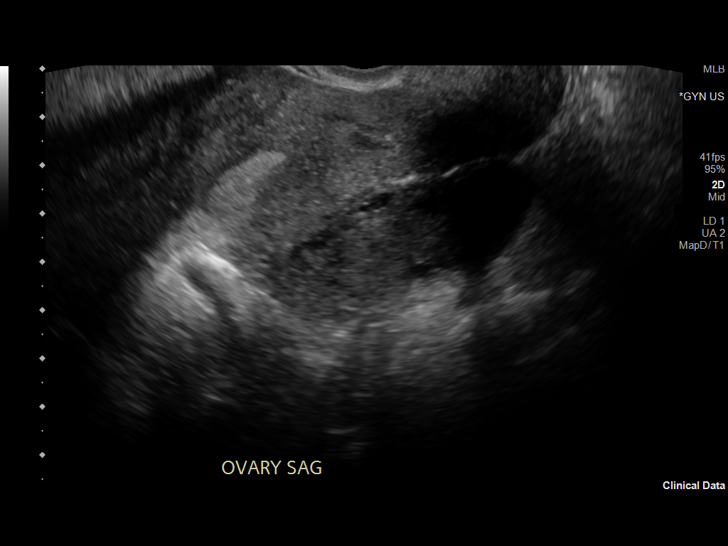
[im 51/88]
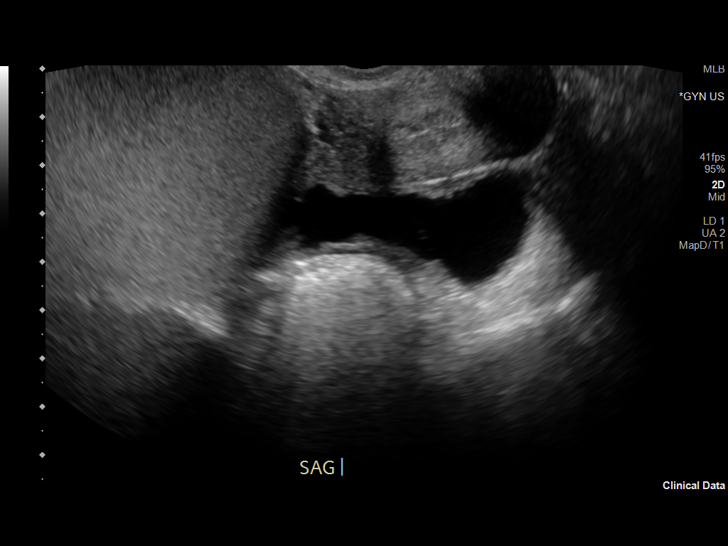
[im 59/88]
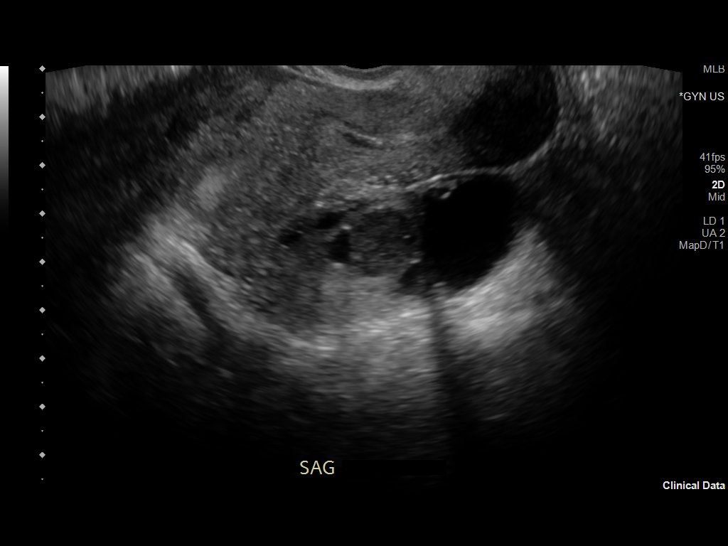
[im 66/88]
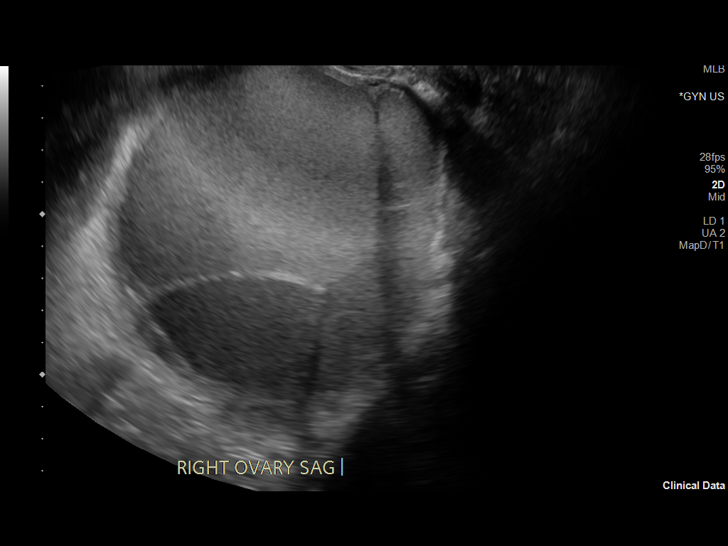
[im 73/88]
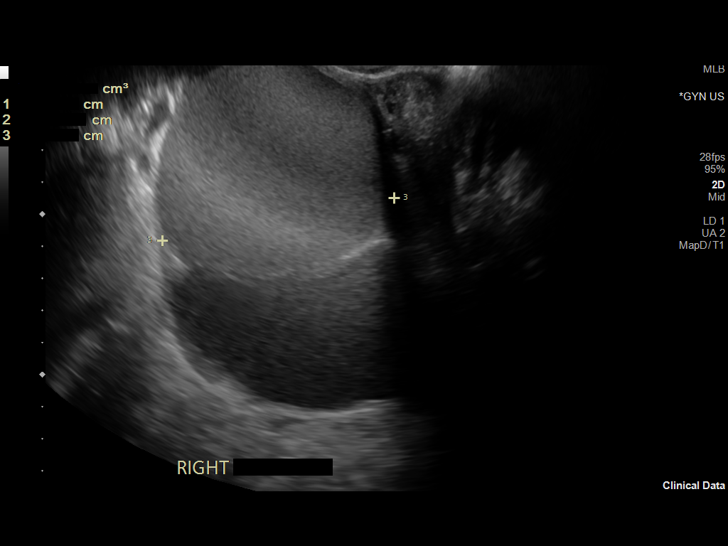
[im 80/88]
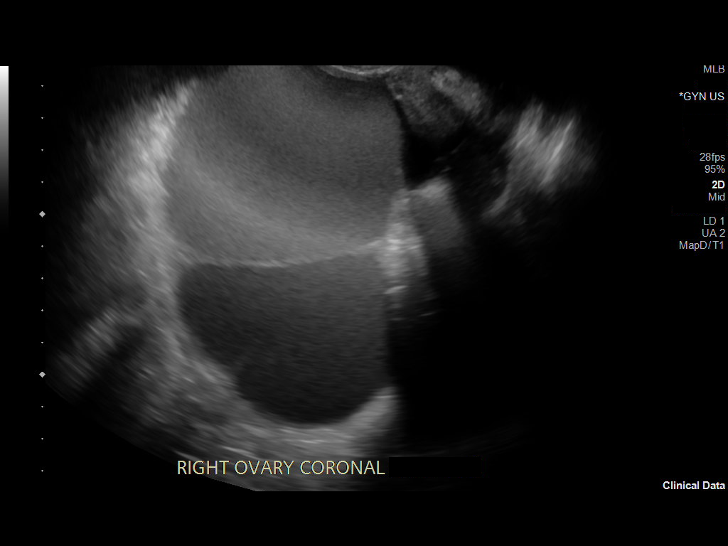
[im 88/88]
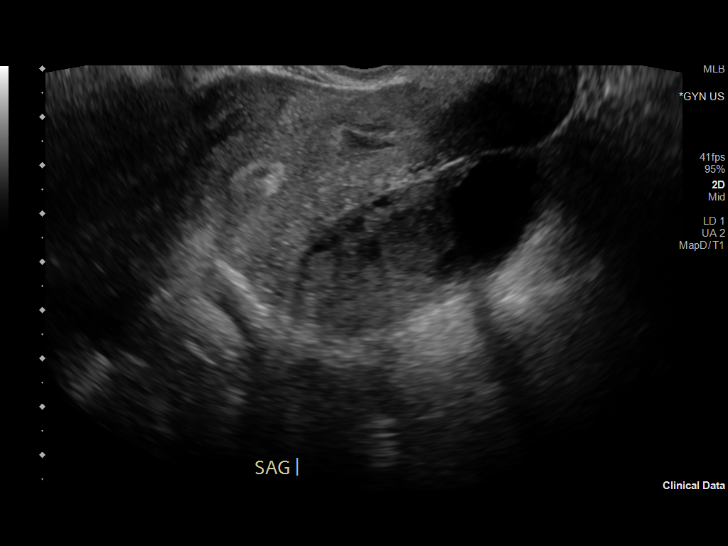

[13 of 25 positions shown; findings below may reference images not displayed]

FINDINGS: Uterus

Measurements: 8.1 x 3.4 x 3.1 cm = volume: 77 mL. Bicornuate
configuration of the uterus is noted.

Endometrium

Thickness: 7 mm.  No focal abnormality visualized.

Right ovary

Measurements: 10.4 x 11.2 x 9.0 cm = volume: 551 mL. Multi-septated
predominantly cystic lesion measuring 10.1 x 9.9 x 7.4 cm is noted
which is significantly enlarged compared to prior exam.

Left ovary

Measurements: 6.1 x 2.5 x 2.3 cm = volume: 18 mL. Stable probable
left hydrosalpinx is noted. 1.9 cm hypoechoic abnormality is noted
which may represent hemorrhagic cyst.

Pulsed Doppler evaluation of both ovaries demonstrates normal
low-resistance arterial and venous waveforms.

Other findings

Trace free fluid is noted which most likely is physiologic.
IMPRESSION: 10.1 x 9.9 x 7.4 cm multi-septated predominantly cystic lesion is
seen arising from the right ovary which is significantly enlarged
compared to prior exam. This is concerning for possible cystic
ovarian neoplasm, and further evaluation with MRI is recommended.

Stable left hydrosalpinx is noted.

1.9 cm hypoechoic abnormality is noted in left ovary which may
represent hemorrhagic cyst. Short-interval follow up ultrasound in
6-12 weeks is recommended, preferably during the week following the
patient's normal menses.

## 2021-04-15 ENCOUNTER — Other Ambulatory Visit: Payer: Self-pay

## 2021-04-15 ENCOUNTER — Encounter (HOSPITAL_BASED_OUTPATIENT_CLINIC_OR_DEPARTMENT_OTHER): Payer: Self-pay

## 2021-04-15 ENCOUNTER — Emergency Department (HOSPITAL_BASED_OUTPATIENT_CLINIC_OR_DEPARTMENT_OTHER)
Admission: EM | Admit: 2021-04-15 | Discharge: 2021-04-15 | Disposition: A | Discharge status: Home / Self Care | Payer: Self-pay | Attending: Student | Admitting: Student

## 2021-04-15 DIAGNOSIS — M545 Low back pain, unspecified: Secondary | ICD-10-CM | POA: Insufficient documentation

## 2021-04-15 DIAGNOSIS — N898 Other specified noninflammatory disorders of vagina: Secondary | ICD-10-CM | POA: Diagnosis not present

## 2021-04-15 DIAGNOSIS — F17210 Nicotine dependence, cigarettes, uncomplicated: Secondary | ICD-10-CM | POA: Insufficient documentation

## 2021-04-15 LAB — URINALYSIS, ROUTINE W REFLEX MICROSCOPIC
Bilirubin Urine: NEGATIVE
Glucose, UA: NEGATIVE mg/dL
Ketones, ur: NEGATIVE mg/dL
Leukocytes,Ua: NEGATIVE
Nitrite: NEGATIVE
Protein, ur: 30 mg/dL — AB
Specific Gravity, Urine: 1.03 (ref 1.005–1.030)
pH: 5.5 (ref 5.0–8.0)

## 2021-04-15 LAB — URINALYSIS, MICROSCOPIC (REFLEX)

## 2021-04-15 LAB — WET PREP, GENITAL
Sperm: NONE SEEN
Trich, Wet Prep: NONE SEEN
Yeast Wet Prep HPF POC: NONE SEEN

## 2021-04-15 LAB — PREGNANCY, URINE: Preg Test, Ur: NEGATIVE

## 2021-04-15 MED ORDER — IBUPROFEN 400 MG PO TABS
600.0000 mg | ORAL_TABLET | Freq: Once | ORAL | Status: AC
  Administered 2021-04-15: 600 mg via ORAL
  Filled 2021-04-15: qty 1

## 2021-04-15 MED ORDER — FLUCONAZOLE 150 MG PO TABS: 150.0000 mg | ORAL_TABLET | Freq: Every day | ORAL | 0 refills | Status: AC

## 2021-04-15 MED ORDER — CEFTRIAXONE SODIUM 500 MG IJ SOLR
500.0000 mg | Freq: Once | INTRAMUSCULAR | Status: AC
  Administered 2021-04-15: 500 mg via INTRAMUSCULAR
  Filled 2021-04-15: qty 500

## 2021-04-15 MED ORDER — METRONIDAZOLE 500 MG PO TABS: 500.0000 mg | ORAL_TABLET | Freq: Two times a day (BID) | ORAL | 0 refills | Status: AC

## 2021-04-15 MED ORDER — DOXYCYCLINE HYCLATE 100 MG PO TABS
100.0000 mg | ORAL_TABLET | Freq: Once | ORAL | Status: AC
  Administered 2021-04-15: 100 mg via ORAL
  Filled 2021-04-15: qty 1

## 2021-04-15 MED ORDER — DOXYCYCLINE HYCLATE 100 MG PO CAPS: 100.0000 mg | ORAL_CAPSULE | Freq: Two times a day (BID) | ORAL | 0 refills | Status: AC

## 2021-04-15 MED ORDER — LIDOCAINE HCL (PF) 1 % IJ SOLN
1.0000 mL | Freq: Once | INTRAMUSCULAR | Status: AC
  Administered 2021-04-15: 1 mL
  Filled 2021-04-15: qty 5

## 2021-04-15 NOTE — ED Notes (Signed)
ED Provider at bedside. 

## 2021-04-15 NOTE — ED Notes (Signed)
Pt NAD, a/ox4. Pt verbalizes understanding of all DC and f/u instructions. All questions answered. Pt walks with steady gait to lobby at DC.  ? ?

## 2021-04-15 NOTE — ED Provider Notes (Signed)
[facility] EMERGENCY DEPARTMENT Provider Note   CSN: [id number] Arrival date & time: 04/15/21  1639     History Chief Complaint  Patient presents with   Back Pain    Nancy Harper is a 30 y.o. female with PMH transverse vaginal septum resection, tubo-ovarian abscess status post right salpingooophorectomy who presents the emergency department for evaluation of vaginal discharge.  Patient states that she recently caught her boyfriend cheating and she was to be tested for STDs.  She endorses mild lumbar achy back pain and intermittent vaginal burning.  Denies chest pain, shortness of breath, Donnell pain, nausea, vomiting or other systemic symptoms.   Back Pain Associated symptoms: no abdominal pain, no chest pain, no dysuria and no fever       Past Medical History:  Diagnosis Date   Pelvic abscess in female 11/2013   Admitted at [facility], underwent IR drainage    Patient Active Problem List   Diagnosis Date Noted   Adnexal mass    Pelvic pain in female    Right tubo-ovarian abscess    Tubo-ovarian abscess 12/01/2014   Bacterial vaginosis 06/10/2014   Normocytic anemia 06/10/2014   TOA (tubo-ovarian abscess); bilateral 06/09/2014    Past Surgical History:  Procedure Laterality Date   VAGINAL SEPTUM RESECTION  2003   Dr. April Manson, [facility]     OB History     Gravida  0   Para  0   Term  0   Preterm  0   AB  0   Living  0      SAB  0   IAB  0   Ectopic  0   Multiple  0   Live Births              History reviewed. No pertinent family history.  Social History   Tobacco Use   Smoking status: Some Days    Types: Cigarettes   Smokeless tobacco: Never   Tobacco comments:    Black and milds  Substance Use Topics   Alcohol use: Yes    Comment: occ   Drug use: No    Home Medications Prior to Admission medications   Medication Sig Start Date End Date Taking? Authorizing Provider  doxycycline (VIBRAMYCIN) 100 MG  capsule Take 1 capsule (100 mg total) by mouth 2 (two) times daily for 7 days. 04/15/21 04/22/21 Yes Jonavan Vanhorn, MD  fluconazole (DIFLUCAN) 150 MG tablet Take 1 tablet (150 mg total) by mouth daily. Take as needed in the setting of a yeast infection after antibiotic completion 04/15/21  Yes Gina Costilla, MD  metroNIDAZOLE (FLAGYL) 500 MG tablet Take 1 tablet (500 mg total) by mouth 2 (two) times daily. 04/15/21  Yes Natalyia Innes, MD  cephALEXin (KEFLEX) 500 MG capsule Take 1 capsule (500 mg total) by mouth 4 (four) times daily. 02/08/18   Terrilee Files, MD  cyclobenzaprine (FLEXERIL) 5 MG tablet Take 1 tablet (5 mg total) by mouth 3 (three) times daily as needed for muscle spasms. 10/20/19   Oralia Manis, DO  diclofenac Sodium (VOLTAREN) 1 % GEL Apply 2 g topically 4 (four) times daily. 10/20/19   Oralia Manis, DO  doxycycline (VIBRAMYCIN) 100 MG capsule Take 1 capsule (100 mg total) by mouth 2 (two) times daily. 09/27/18   Emi Holes, PA-C  erythromycin ophthalmic ointment Place a 1/2 inch ribbon of ointment into the right lower eyelid TID. 10/07/17   Vanetta Mulders, MD  metroNIDAZOLE (FLAGYL) 500 MG  tablet Take 1 tablet (500 mg total) by mouth 2 (two) times daily. 01/10/19   Gwyneth Sprout, MD    Allergies    Patient has no known allergies.  Review of Systems   Review of Systems  Constitutional:  Negative for chills and fever.  HENT:  Negative for ear pain and sore throat.   Eyes:  Negative for pain and visual disturbance.  Respiratory:  Negative for cough and shortness of breath.   Cardiovascular:  Negative for chest pain and palpitations.  Gastrointestinal:  Negative for abdominal pain and vomiting.  Genitourinary:  Positive for vaginal discharge. Negative for dysuria and hematuria.  Musculoskeletal:  Positive for back pain. Negative for arthralgias.  Skin:  Negative for color change and rash.  Neurological:  Negative for seizures and syncope.  All other systems  reviewed and are negative.  Physical Exam Updated Vital Signs BP (!) 142/78 (BP Location: Right Arm)   Pulse 98   Temp 98.4 F (36.9 C) (Oral)   Resp 18   Ht 5\' 4"  (1.626 m)   Wt 94.3 kg   SpO2 100%   BMI 35.70 kg/m   Physical Exam Vitals and nursing note reviewed.  Constitutional:      General: She is not in acute distress.    Appearance: She is well-developed.  HENT:     Head: Normocephalic and atraumatic.  Eyes:     Conjunctiva/sclera: Conjunctivae normal.  Cardiovascular:     Rate and Rhythm: Normal rate and regular rhythm.     Heart sounds: No murmur heard. Pulmonary:     Effort: Pulmonary effort is normal. No respiratory distress.     Breath sounds: Normal breath sounds.  Abdominal:     Palpations: Abdomen is soft.     Tenderness: There is no abdominal tenderness.  Genitourinary:    Comments: Cervix not visualized due to altered anatomy, white discharge present in the vaginal vault Musculoskeletal:     Cervical back: Neck supple.  Skin:    General: Skin is warm and dry.  Neurological:     Mental Status: She is alert.    ED Results / Procedures / Treatments   Labs (all labs ordered are listed, but only abnormal results are displayed) Labs Reviewed  WET PREP, GENITAL - Abnormal; Notable for the following components:      Result Value   Clue Cells Wet Prep HPF POC PRESENT (*)    WBC, Wet Prep HPF POC MODERATE (*)    All other components within normal limits  URINALYSIS, ROUTINE W REFLEX MICROSCOPIC - Abnormal; Notable for the following components:   Hgb urine dipstick MODERATE (*)    Protein, ur 30 (*)    All other components within normal limits  URINALYSIS, MICROSCOPIC (REFLEX) - Abnormal; Notable for the following components:   Bacteria, UA RARE (*)    All other components within normal limits  PREGNANCY, URINE  GC/CHLAMYDIA PROBE AMP ([place]) NOT AT [facility]    EKG None  Radiology No results found.  Procedures Procedures   Medications  Ordered in ED Medications  ibuprofen (ADVIL) tablet 600 mg (600 mg Oral Given 04/15/21 1758)  cefTRIAXone (ROCEPHIN) injection 500 mg (500 mg Intramuscular Given 04/15/21 1821)  lidocaine (PF) (XYLOCAINE) 1 % injection 1 mL (1 mL Other Given 04/15/21 1824)  doxycycline (VIBRA-TABS) tablet 100 mg (100 mg Oral Given 04/15/21 1824)    ED Course  I have reviewed the triage vital signs and the nursing notes.  Pertinent labs & imaging  results that were available during my care of the patient were reviewed by me and considered in my medical decision making (see chart for details).    MDM Rules/Calculators/A&P                           Patient seen the emergency department for evaluation of vaginal discharge.  Physical exam reveals white discharge in the vaginal vault that on testing is present for clue cells and white blood cells.  Patient empirically treated with ceftriaxone and doxycycline for GC chlamydia.  Urinalysis unremarkable.  Patient discharged with a 7-day course of doxycycline and a 7-day course of Flagyl.  She was encouraged to follow-up with her OB/GYN and she was discharged. Final Clinical Impression(s) / ED Diagnoses Final diagnoses:  Vaginal discharge    Rx / DC Orders ED Discharge Orders          Ordered    doxycycline (VIBRAMYCIN) 100 MG capsule  2 times daily        04/15/21 1821    metroNIDAZOLE (FLAGYL) 500 MG tablet  2 times daily        04/15/21 1821    fluconazole (DIFLUCAN) 150 MG tablet  Daily        04/15/21 1821             Olaoluwa Grieder, Wyn Forster, MD 04/15/21 2326

## 2021-04-15 NOTE — ED Notes (Signed)
Pelvic cart at bedside. 

## 2021-04-15 NOTE — ED Notes (Signed)
MD with chaperone for pelvic

## 2021-04-15 NOTE — Discharge Instructions (Addendum)
You were seen in the emergency department for evaluation of vaginal discharge and back pain.  Large in the vagina that may be consistent with an STD or bacterial vaginosis.  You were treated empirically for gonorrhea and chlamydia and you will take 2 antibiotics for the next 7 days.  An additional dose of Diflucan was provided in case you develop symptoms of a yeast infection after completing antibiotic therapy.  Please follow-up with your OB/GYN or return to the emergency department if your symptoms do not improve.

## 2021-04-15 NOTE — ED Triage Notes (Addendum)
Pt c/o "vaginal tingling" x 1 week. Then started to get lower back pain. Denies vaginal discharge or other urinary symptoms.   Wants to be tested for STD

## 2021-04-16 LAB — GC/CHLAMYDIA PROBE AMP (~~LOC~~) NOT AT ARMC
Chlamydia: NEGATIVE
Comment: NEGATIVE
Comment: NORMAL
Neisseria Gonorrhea: NEGATIVE

## 2021-05-03 ENCOUNTER — Emergency Department (HOSPITAL_BASED_OUTPATIENT_CLINIC_OR_DEPARTMENT_OTHER)
Admission: EM | Admit: 2021-05-03 | Discharge: 2021-05-03 | Disposition: A | Discharge status: Home / Self Care | Payer: BLUE CROSS/BLUE SHIELD | Attending: Emergency Medicine | Admitting: Emergency Medicine

## 2021-05-03 ENCOUNTER — Encounter (HOSPITAL_BASED_OUTPATIENT_CLINIC_OR_DEPARTMENT_OTHER): Payer: Self-pay | Admitting: Urology

## 2021-05-03 ENCOUNTER — Other Ambulatory Visit: Payer: Self-pay

## 2021-05-03 DIAGNOSIS — F17210 Nicotine dependence, cigarettes, uncomplicated: Secondary | ICD-10-CM | POA: Insufficient documentation

## 2021-05-03 DIAGNOSIS — J3489 Other specified disorders of nose and nasal sinuses: Secondary | ICD-10-CM | POA: Diagnosis not present

## 2021-05-03 DIAGNOSIS — J111 Influenza due to unidentified influenza virus with other respiratory manifestations: Secondary | ICD-10-CM

## 2021-05-03 DIAGNOSIS — Z20822 Contact with and (suspected) exposure to covid-19: Secondary | ICD-10-CM | POA: Diagnosis not present

## 2021-05-03 DIAGNOSIS — J101 Influenza due to other identified influenza virus with other respiratory manifestations: Secondary | ICD-10-CM | POA: Insufficient documentation

## 2021-05-03 DIAGNOSIS — R0981 Nasal congestion: Secondary | ICD-10-CM | POA: Diagnosis present

## 2021-05-03 LAB — RESP PANEL BY RT-PCR (FLU A&B, COVID) ARPGX2
Influenza A by PCR: POSITIVE — AB
Influenza B by PCR: NEGATIVE
SARS Coronavirus 2 by RT PCR: NEGATIVE

## 2021-05-03 MED ORDER — OSELTAMIVIR PHOSPHATE 75 MG PO CAPS: 75.0000 mg | ORAL_CAPSULE | Freq: Two times a day (BID) | ORAL | 0 refills | Status: AC

## 2021-05-03 MED ORDER — IBUPROFEN 800 MG PO TABS
800.0000 mg | ORAL_TABLET | Freq: Once | ORAL | Status: AC
  Administered 2021-05-03: 800 mg via ORAL
  Filled 2021-05-03: qty 1

## 2021-05-03 MED ORDER — FLUTICASONE PROPIONATE 50 MCG/ACT NA SUSP: 2.0000 | Freq: Every day | NASAL | 0 refills | Status: AC

## 2021-05-03 MED ORDER — ACETAMINOPHEN 325 MG PO TABS: 650.0000 mg | ORAL_TABLET | Freq: Four times a day (QID) | ORAL | 0 refills | Status: AC | PRN

## 2021-05-03 NOTE — ED Provider Notes (Signed)
[facility] EMERGENCY DEPARTMENT Provider Note   CSN: [id number] Arrival date & time: 05/03/21  6160     History Chief Complaint  Patient presents with   Nasal Congestion   Fever    Nancy Harper is a 30 y.o. female.  This is a 30 y.o. female with significant medical history as below, including TOA who presents to the ED with complaint of congestion, URI s/s onset yesterday morning, symptoms include cough, clear rhinorrhea, sinus pressure, fatigue, malaise, fever tmax 101. She has been taking tylenol and cold/sinus medication over last day with mild improvement to her symptoms. No n/v/d, no change to bowel/bladder fxn. No abd pain. She is tolerating PO. No sick contacts or recent travel. She is not vaccinated for covid 19 or flu.    The history is provided by the patient. No language interpreter was used.  Fever Max temp prior to arrival:  101 Duration:  1 day Associated symptoms: congestion and rhinorrhea   Associated symptoms: no chest pain, no chills, no confusion, no cough, no headaches, no nausea, no rash and no vomiting       Past Medical History:  Diagnosis Date   Pelvic abscess in female 11/2013   Admitted at [facility], underwent IR drainage    Patient Active Problem List   Diagnosis Date Noted   Adnexal mass    Pelvic pain in female    Right tubo-ovarian abscess    Tubo-ovarian abscess 12/01/2014   Bacterial vaginosis 06/10/2014   Normocytic anemia 06/10/2014   TOA (tubo-ovarian abscess); bilateral 06/09/2014    Past Surgical History:  Procedure Laterality Date   VAGINAL SEPTUM RESECTION  2003   Dr. April Manson, [facility]     OB History     Gravida  0   Para  0   Term  0   Preterm  0   AB  0   Living  0      SAB  0   IAB  0   Ectopic  0   Multiple  0   Live Births              History reviewed. No pertinent family history.  Social History   Tobacco Use   Smoking status: Some Days    Types: Cigarettes    Smokeless tobacco: Never   Tobacco comments:    Black and milds  Substance Use Topics   Alcohol use: Yes    Comment: occ   Drug use: No    Home Medications Prior to Admission medications   Medication Sig Start Date End Date Taking? Authorizing Provider  acetaminophen (TYLENOL) 325 MG tablet Take 2 tablets (650 mg total) by mouth every 6 (six) hours as needed. 05/03/21  Yes Tanda Rockers A, DO  fluticasone (FLONASE) 50 MCG/ACT nasal spray Place 2 sprays into both nostrils daily. 05/03/21  Yes Sloan Leiter, DO  oseltamivir (TAMIFLU) 75 MG capsule Take 1 capsule (75 mg total) by mouth every 12 (twelve) hours. 05/03/21  Yes Tanda Rockers A, DO  cephALEXin (KEFLEX) 500 MG capsule Take 1 capsule (500 mg total) by mouth 4 (four) times daily. 02/08/18   Terrilee Files, MD  cyclobenzaprine (FLEXERIL) 5 MG tablet Take 1 tablet (5 mg total) by mouth 3 (three) times daily as needed for muscle spasms. 10/20/19   Oralia Manis, DO  diclofenac Sodium (VOLTAREN) 1 % GEL Apply 2 g topically 4 (four) times daily. 10/20/19   Oralia Manis, DO  doxycycline (VIBRAMYCIN) 100  MG capsule Take 1 capsule (100 mg total) by mouth 2 (two) times daily. 09/27/18   Emi Holes, PA-C  erythromycin ophthalmic ointment Place a 1/2 inch ribbon of ointment into the right lower eyelid TID. 10/07/17   Vanetta Mulders, MD  fluconazole (DIFLUCAN) 150 MG tablet Take 1 tablet (150 mg total) by mouth daily. Take as needed in the setting of a yeast infection after antibiotic completion 04/15/21   Kommor, Wyn Forster, MD  metroNIDAZOLE (FLAGYL) 500 MG tablet Take 1 tablet (500 mg total) by mouth 2 (two) times daily. 01/10/19   Gwyneth Sprout, MD  metroNIDAZOLE (FLAGYL) 500 MG tablet Take 1 tablet (500 mg total) by mouth 2 (two) times daily. 04/15/21   Kommor, Wyn Forster, MD    Allergies    Patient has no known allergies.  Review of Systems   Review of Systems  Constitutional:  Positive for fever. Negative for chills.  HENT:   Positive for congestion, rhinorrhea and sinus pressure. Negative for facial swelling and trouble swallowing.   Eyes:  Negative for photophobia and visual disturbance.  Respiratory:  Negative for cough and shortness of breath.   Cardiovascular:  Negative for chest pain and palpitations.  Gastrointestinal:  Negative for abdominal pain, nausea and vomiting.  Endocrine: Negative for polydipsia and polyuria.  Genitourinary:  Negative for difficulty urinating and hematuria.  Musculoskeletal:  Negative for gait problem and joint swelling.  Skin:  Negative for pallor and rash.  Neurological:  Negative for syncope and headaches.  Psychiatric/Behavioral:  Negative for agitation and confusion.    Physical Exam Updated Vital Signs BP 138/88 (BP Location: Right Arm)   Pulse (!) 107   Temp 98.8 F (37.1 C) (Oral)   Resp 18   Ht 5\' 4"  (1.626 m)   Wt 90.7 kg   SpO2 99%   BMI 34.33 kg/m   Physical Exam Vitals and nursing note reviewed.  Constitutional:      General: She is not in acute distress.    Appearance: Normal appearance.  HENT:     Head: Normocephalic and atraumatic. No raccoon eyes, Battle's sign, right periorbital erythema or left periorbital erythema.     Jaw: There is normal jaw occlusion. No trismus.     Right Ear: External ear normal.     Left Ear: External ear normal.     Nose: Nose normal.     Mouth/Throat:     Mouth: Mucous membranes are moist.     Comments: No drooling Eyes:     General: No scleral icterus.       Right eye: No discharge.        Left eye: No discharge.  Cardiovascular:     Rate and Rhythm: Normal rate and regular rhythm.     Pulses: Normal pulses.     Heart sounds: Normal heart sounds.  Pulmonary:     Effort: Pulmonary effort is normal. No respiratory distress.     Breath sounds: Normal breath sounds.  Abdominal:     General: Abdomen is flat.     Tenderness: There is no abdominal tenderness.  Musculoskeletal:        General: Normal range of  motion.     Cervical back: Normal range of motion.     Right lower leg: No edema.     Left lower leg: No edema.  Skin:    General: Skin is warm and dry.     Capillary Refill: Capillary refill takes less than 2 seconds.  Neurological:  Mental Status: She is alert.  Psychiatric:        Mood and Affect: Mood normal.        Behavior: Behavior normal.    ED Results / Procedures / Treatments   Labs (all labs ordered are listed, but only abnormal results are displayed) Labs Reviewed  RESP PANEL BY RT-PCR (FLU A&B, COVID) ARPGX2 - Abnormal; Notable for the following components:      Result Value   Influenza A by PCR POSITIVE (*)    All other components within normal limits    EKG None  Radiology No results found.  Procedures Procedures   Medications Ordered in ED Medications  ibuprofen (ADVIL) tablet 800 mg (800 mg Oral Given 05/03/21 0744)    ED Course  I have reviewed the triage vital signs and the nursing notes.  Pertinent labs & imaging results that were available during my care of the patient were reviewed by me and considered in my medical decision making (see chart for details).    MDM Rules/Calculators/A&P                          CC: URI s/so  This patient complains of complaint as above; this involves an extensive number of treatment options and is a complaint that carries with it a high risk of complications and morbidity. Vital signs were reviewed. Serious etiologies considered.  Record review:  Previous records obtained and reviewed   Work up as above, notable for:   Labs & imaging results that were available during my care of the patient were reviewed by me and considered in my medical decision making.   Management: Give motrin  Reassessment:   Pt stable. Found to be positive for FLU A, she is not vaccinated. She is interested in tamiflu and is within treatment window. Recommended oral rehydration, anti-pyretics, and supportive care to patient.    The patient improved significantly and was discharged in stable condition. Detailed discussions were had with the patient regarding current findings, and need for close f/u with PCP or on call doctor. The patient has been instructed to return immediately if the symptoms worsen in any way for re-evaluation. Patient verbalized understanding and is in agreement with current care plan. All questions answered prior to discharge.           This chart was dictated using voice recognition software.  Despite best efforts to proofread,  errors can occur which can change the documentation meaning.  Final Clinical Impression(s) / ED Diagnoses Final diagnoses:  Influenza    Rx / DC Orders ED Discharge Orders          Ordered    oseltamivir (TAMIFLU) 75 MG capsule  Every 12 hours        05/03/21 0815    fluticasone (FLONASE) 50 MCG/ACT nasal spray  Daily        05/03/21 0815    acetaminophen (TYLENOL) 325 MG tablet  Every 6 hours PRN        05/03/21 0815             Sloan Leiter, DO 05/03/21 [phone]

## 2021-05-03 NOTE — ED Triage Notes (Signed)
Pt states cough, fever, and sinus congestion since yesterday, fever 101 at home.  Denies N/V/D.

## 2021-05-03 NOTE — ED Notes (Signed)
ED Provider at bedside. 

## 2021-05-03 NOTE — Discharge Instructions (Addendum)
Continue drinking liquids to stay hydrated.

## 2022-01-21 ENCOUNTER — Emergency Department (HOSPITAL_BASED_OUTPATIENT_CLINIC_OR_DEPARTMENT_OTHER): Payer: BLUE CROSS/BLUE SHIELD

## 2022-01-21 ENCOUNTER — Encounter (HOSPITAL_BASED_OUTPATIENT_CLINIC_OR_DEPARTMENT_OTHER): Payer: Self-pay | Admitting: Emergency Medicine

## 2022-01-21 ENCOUNTER — Emergency Department (HOSPITAL_BASED_OUTPATIENT_CLINIC_OR_DEPARTMENT_OTHER)
Admission: EM | Admit: 2022-01-21 | Discharge: 2022-01-21 | Disposition: A | Discharge status: Home / Self Care | Payer: BLUE CROSS/BLUE SHIELD | Attending: Emergency Medicine | Admitting: Emergency Medicine

## 2022-01-21 DIAGNOSIS — R112 Nausea with vomiting, unspecified: Secondary | ICD-10-CM | POA: Insufficient documentation

## 2022-01-21 DIAGNOSIS — R051 Acute cough: Secondary | ICD-10-CM | POA: Insufficient documentation

## 2022-01-21 DIAGNOSIS — Z3202 Encounter for pregnancy test, result negative: Secondary | ICD-10-CM | POA: Insufficient documentation

## 2022-01-21 DIAGNOSIS — Z20822 Contact with and (suspected) exposure to covid-19: Secondary | ICD-10-CM | POA: Insufficient documentation

## 2022-01-21 DIAGNOSIS — R059 Cough, unspecified: Secondary | ICD-10-CM | POA: Diagnosis present

## 2022-01-21 DIAGNOSIS — R300 Dysuria: Secondary | ICD-10-CM | POA: Diagnosis not present

## 2022-01-21 LAB — URINALYSIS, ROUTINE W REFLEX MICROSCOPIC
Bilirubin Urine: NEGATIVE
Glucose, UA: NEGATIVE mg/dL
Ketones, ur: NEGATIVE mg/dL
Leukocytes,Ua: NEGATIVE
Nitrite: POSITIVE — AB
Protein, ur: NEGATIVE mg/dL
Specific Gravity, Urine: >=1.03 (ref 1.005–1.030)
pH: 5.5 (ref 5.0–8.0)

## 2022-01-21 LAB — PREGNANCY, URINE: Preg Test, Ur: NEGATIVE

## 2022-01-21 LAB — URINALYSIS, MICROSCOPIC (REFLEX): WBC, UA: NONE SEEN WBC/hpf (ref 0–5)

## 2022-01-21 LAB — SARS CORONAVIRUS 2 BY RT PCR: SARS Coronavirus 2 by RT PCR: NEGATIVE

## 2022-01-21 NOTE — ED Provider Notes (Signed)
[facility] EMERGENCY DEPARTMENT Provider Note   CSN: [id number] Arrival date & time: 01/21/22  1359     History  Chief Complaint  Patient presents with   Emesis    Nancy Harper is a 31 y.o. female who presents today for evaluation of a productive cough.  She states her symptoms started last week.  She has had 3 episodes of vomiting, 1 of which has been posttussive.  She states that she does not have any congestion.  She has not had any fevers.  No shortness of breath or chest pain.  No known sick contacts. Due to her 3 episodes of vomiting she is requesting a pregnancy test. She denies any pelvic pain or abnormal discharge. She states that she feels like she has a tickle in her throat.  Her cough is worse at night.  She denies specific throat pain.  No changes in phonation.  No ear pain.  She denies any abdominal pain.  She has been able to tolerate p.o. intake despite the episodes of nausea and vomiting.  She denies any diarrhea.  HPI     Home Medications Prior to Admission medications   Medication Sig Start Date End Date Taking? Authorizing Provider  acetaminophen (TYLENOL) 325 MG tablet Take 2 tablets (650 mg total) by mouth every 6 (six) hours as needed. 05/03/21   Sloan Leiter, DO  cephALEXin (KEFLEX) 500 MG capsule Take 1 capsule (500 mg total) by mouth 4 (four) times daily. 02/08/18   Terrilee Files, MD  cyclobenzaprine (FLEXERIL) 5 MG tablet Take 1 tablet (5 mg total) by mouth 3 (three) times daily as needed for muscle spasms. 10/20/19   Oralia Manis, DO  diclofenac Sodium (VOLTAREN) 1 % GEL Apply 2 g topically 4 (four) times daily. 10/20/19   Oralia Manis, DO  doxycycline (VIBRAMYCIN) 100 MG capsule Take 1 capsule (100 mg total) by mouth 2 (two) times daily. 09/27/18   Emi Holes, PA-C  erythromycin ophthalmic ointment Place a 1/2 inch ribbon of ointment into the right lower eyelid TID. 10/07/17   Vanetta Mulders, MD  fluconazole (DIFLUCAN) 150 MG  tablet Take 1 tablet (150 mg total) by mouth daily. Take as needed in the setting of a yeast infection after antibiotic completion 04/15/21   Kommor, Wyn Forster, MD  fluticasone [facility]) 50 MCG/ACT nasal spray Place 2 sprays into both nostrils daily. 05/03/21   Sloan Leiter, DO  metroNIDAZOLE (FLAGYL) 500 MG tablet Take 1 tablet (500 mg total) by mouth 2 (two) times daily. 01/10/19   Gwyneth Sprout, MD  metroNIDAZOLE (FLAGYL) 500 MG tablet Take 1 tablet (500 mg total) by mouth 2 (two) times daily. 04/15/21   Kommor, [place], MD  oseltamivir (TAMIFLU) 75 MG capsule Take 1 capsule (75 mg total) by mouth every 12 (twelve) hours. 05/03/21   Sloan Leiter, DO      Allergies    Patient has no known allergies.     Physical Exam Updated Vital Signs BP (!) 142/96   Pulse 67   Temp 98.6 F (37 C) (Oral)   Resp 15   SpO2 100%  Physical Exam Vitals and nursing note reviewed.  Constitutional:      General: She is not in acute distress.    Appearance: Normal appearance. She is not diaphoretic.  HENT:     Head: Normocephalic and atraumatic.     Right Ear: Tympanic membrane, ear canal and external ear normal.     Left Ear: Tympanic membrane, ear  canal and external ear normal.     Nose: No congestion or rhinorrhea.     Mouth/Throat:     Mouth: Mucous membranes are moist.     Pharynx: Oropharynx is clear. No oropharyngeal exudate or posterior oropharyngeal erythema.  Eyes:     General: No scleral icterus.       Right eye: No discharge.        Left eye: No discharge.     Conjunctiva/sclera: Conjunctivae normal.  Cardiovascular:     Rate and Rhythm: Normal rate and regular rhythm.     Heart sounds: Normal heart sounds.  Pulmonary:     Effort: Pulmonary effort is normal. No respiratory distress.     Breath sounds: Normal breath sounds. No stridor.  Abdominal:     General: There is no distension.     Tenderness: There is no abdominal tenderness. There is no guarding.  Musculoskeletal:         General: No deformity.     Cervical back: Normal range of motion and neck supple.  Lymphadenopathy:     Cervical: No cervical adenopathy.  Skin:    General: Skin is warm and dry.  Neurological:     Mental Status: She is alert.     Motor: No abnormal muscle tone.  Psychiatric:        Mood and Affect: Mood normal.        Behavior: Behavior normal.     ED Results / Procedures / Treatments   Labs (all labs ordered are listed, but only abnormal results are displayed) Labs Reviewed  URINALYSIS, ROUTINE W REFLEX MICROSCOPIC - Abnormal; Notable for the following components:      Result Value   APPearance HAZY (*)    Hgb urine dipstick MODERATE (*)    Nitrite POSITIVE (*)    All other components within normal limits  URINALYSIS, MICROSCOPIC (REFLEX) - Abnormal; Notable for the following components:   Bacteria, UA FEW (*)    All other components within normal limits  SARS CORONAVIRUS 2 BY RT PCR  URINE CULTURE  PREGNANCY, URINE    EKG None  Radiology DG Chest 2 View  Result Date: 01/21/2022 CLINICAL DATA:  new productive cough EXAM: CHEST - 2 VIEW COMPARISON:  None Available. FINDINGS: The heart size and mediastinal contours are within normal limits. Both lungs are clear. The visualized skeletal structures are unremarkable. IMPRESSION: No active cardiopulmonary disease. Electronically Signed   By: Feliberto Harts M.D.   On: 01/21/2022 15:12    Procedures Procedures    Medications Ordered in ED Medications - No data to display  ED Course/ Medical Decision Making/ A&P                           Medical Decision Making Patient presents today for evaluation of approximately 1 week of cough.  During this she has been occasionally nauseous which she attributes to a tickle in her throat and had 3 episodes of vomiting. Her physical exam is very reassuring.  Her abdomen is soft nontender nondistended and she is not having any abdominal pain.  She has clear lung sounds bilaterally.   She is afebrile, not tachycardic or tachypneic and denies shortness of breath or chest pain. Doubt strep, she does not have significant tonsillar enlargement or exudates.  She is afebrile without cough.   Problems Addressed: Acute cough: acute illness or injury Lab test negative for COVID-19 virus: acute illness or injury Nausea and  vomiting, unspecified vomiting type: acute illness or injury Negative pregnancy test: acute illness or injury  Amount and/or Complexity of Data Reviewed Labs: ordered.    Details: Pregnancy test is negative.  UA does have positive nitrites, microscopy shows 6-10 squamous epithelial cells with few bacteria and no white cells.  Given her nausea/vomiting and nitrite positive will send for culture.  Given afebrile with out dysuria will defer antibiotics until culture result. COVID test is negative Radiology: ordered.    Details: No consolidation pneumothorax or other acute abnormalities  Risk OTC drugs. Decision regarding hospitalization.   Patient and I did discuss that her blood pressure is borderline elevated.  I recommended that she avoid any decongestant medications given this and that she follow-up with primary care doctor.  She does not appear to have a acute or surgical abdomen.  She is tolerating p.o. intake, states she is not concerned about dehydration, and is very well-appearing.  Recommended conservative care, monitoring of her symptoms.  Return precautions were discussed with patient who states their understanding.  At the time of discharge patient denied any unaddressed complaints or concerns.  Patient is agreeable for discharge home.  Note: Portions of this report may have been transcribed using voice recognition software. Every effort was made to ensure accuracy; however, inadvertent computerized transcription errors may be present         Final Clinical Impression(s) / ED Diagnoses Final diagnoses:  Acute cough  Nausea and  vomiting, unspecified vomiting type  Negative pregnancy test  Lab test negative for COVID-19 virus    Rx / DC Orders ED Discharge Orders     None         Norman Clay 01/22/22 0020    Cheryll Cockayne, MD 02/04/22 [phone]

## 2022-01-21 NOTE — Discharge Instructions (Addendum)
As we discussed today I strongly recommend that you obtain a primary care doctor. If you do not have 1 you are not getting your appropriate healthcare maintenance screenings including screenings and evaluation for high blood pressure, high cholesterol, diabetes and others.  Your blood pressure was borderline elevated today.  It would be good to get this rechecked in the next month.  I would try to avoid any decongestant medications.  As we discussed today I did send your urine for culture.  Need antibiotics you will receive a phone call.  If you are still having symptoms then 1 week I would recommend to retaking a pregnancy test.   If you develop fevers, shortness of breath, burning with urination, urinating frequently, you develop abdominal pain, are unable to tolerate liquids, or have any new or concerning symptoms please seek additional medical care and evaluation.

## 2022-01-21 NOTE — ED Triage Notes (Signed)
N/v and productive cough since last week. Also requesting pregnancy test.

## 2022-01-21 NOTE — ED Notes (Signed)
Called lab and spoke to Hawley to add culture onto previously collected urine.

## 2022-01-22 LAB — URINE CULTURE: Culture: <10000 — AB

## 2022-04-12 ENCOUNTER — Emergency Department (HOSPITAL_COMMUNITY): Payer: BLUE CROSS/BLUE SHIELD

## 2022-04-12 ENCOUNTER — Encounter (HOSPITAL_COMMUNITY): Payer: Self-pay | Admitting: Emergency Medicine

## 2022-04-12 ENCOUNTER — Emergency Department (HOSPITAL_COMMUNITY)
Admission: EM | Admit: 2022-04-12 | Discharge: 2022-04-12 | Discharge status: Against Medical Advice | Payer: BLUE CROSS/BLUE SHIELD | Attending: Emergency Medicine | Admitting: Emergency Medicine

## 2022-04-12 ENCOUNTER — Other Ambulatory Visit: Payer: Self-pay

## 2022-04-12 DIAGNOSIS — Z5321 Procedure and treatment not carried out due to patient leaving prior to being seen by health care provider: Secondary | ICD-10-CM | POA: Diagnosis not present

## 2022-04-12 DIAGNOSIS — W208XXA Other cause of strike by thrown, projected or falling object, initial encounter: Secondary | ICD-10-CM | POA: Insufficient documentation

## 2022-04-12 DIAGNOSIS — Y92838 Other recreation area as the place of occurrence of the external cause: Secondary | ICD-10-CM | POA: Insufficient documentation

## 2022-04-12 DIAGNOSIS — M25572 Pain in left ankle and joints of left foot: Secondary | ICD-10-CM | POA: Insufficient documentation

## 2022-04-12 NOTE — ED Notes (Signed)
PATIENT LEFT AMA 

## 2022-04-12 NOTE — ED Triage Notes (Signed)
Pt reports someone fell on her foot while at a club tonight. Ambulatory.

## 2023-02-18 ENCOUNTER — Emergency Department (HOSPITAL_BASED_OUTPATIENT_CLINIC_OR_DEPARTMENT_OTHER)
Admission: EM | Admit: 2023-02-18 | Discharge: 2023-02-18 | Disposition: A | Discharge status: Home / Self Care | Payer: BC Managed Care – PPO | Attending: Emergency Medicine | Admitting: Emergency Medicine

## 2023-02-18 ENCOUNTER — Other Ambulatory Visit: Payer: Self-pay

## 2023-02-18 DIAGNOSIS — N390 Urinary tract infection, site not specified: Secondary | ICD-10-CM | POA: Insufficient documentation

## 2023-02-18 LAB — URINALYSIS, ROUTINE W REFLEX MICROSCOPIC
Bilirubin Urine: NEGATIVE
Glucose, UA: 250 mg/dL — AB
Ketones, ur: NEGATIVE mg/dL
Leukocytes,Ua: NEGATIVE
Nitrite: NEGATIVE
Protein, ur: 100 mg/dL — AB
Specific Gravity, Urine: >=1.03 (ref 1.005–1.030)
pH: 5.5 (ref 5.0–8.0)

## 2023-02-18 LAB — URINALYSIS, MICROSCOPIC (REFLEX)

## 2023-02-18 LAB — PREGNANCY, URINE: Preg Test, Ur: NEGATIVE

## 2023-02-18 MED ORDER — PHENAZOPYRIDINE HCL 200 MG PO TABS: 200.0000 mg | ORAL_TABLET | Freq: Three times a day (TID) | ORAL | 0 refills | Status: AC

## 2023-02-18 MED ORDER — CEPHALEXIN 500 MG PO CAPS: 500.0000 mg | ORAL_CAPSULE | Freq: Four times a day (QID) | ORAL | 0 refills | Status: DC

## 2023-02-18 NOTE — ED Triage Notes (Signed)
Patient presents to ED via POV from home. Here with concerns of continued UTI. Patient recent dx with UTI. Finished course of antibiotics. Reported continued urinary frequency.

## 2023-02-18 NOTE — ED Provider Notes (Signed)
[place] [facility] Provider Note   CSN: [id number] Arrival date & time: 02/18/23  1230     History {Add pertinent medical, surgical, social history, OB history to HPI:1} Chief Complaint  Patient presents with   Urinary Tract Infection    Nancy Harper is a 32 y.o. female.  She has a history of frequent UTIs.  She said she started with symptoms a few weeks ago, had a televisit and was prescribed Macrobid.  Symptoms improved after a few days but quickly returned.  She continues to endorse dysuria.  Frequency.  No fevers chills nausea vomiting back pain.  No vaginal bleeding or discharge.  She is sexually active, denies any concerns for STD.  The history is provided by the patient.  Urinary Tract Infection Pain quality:  Burning Pain severity:  Moderate Onset quality:  Gradual Duration:  3 days Timing:  Intermittent Progression:  Unchanged Chronicity:  Recurrent Recent urinary tract infections: yes   Relieved by:  None tried Worsened by:  Nothing Ineffective treatments:  None tried Urinary symptoms: frequent urination   Urinary symptoms: no hematuria and no bladder incontinence   Associated symptoms: no abdominal pain, no fever, no flank pain, no genital lesions, no nausea, no vaginal discharge and no vomiting   Risk factors: recurrent urinary tract infections        Home Medications Prior to Admission medications   Medication Sig Start Date End Date Taking? Authorizing Provider  acetaminophen (TYLENOL) 325 MG tablet Take 2 tablets (650 mg total) by mouth every 6 (six) hours as needed. 05/03/21   Sloan Leiter, DO  cephALEXin (KEFLEX) 500 MG capsule Take 1 capsule (500 mg total) by mouth 4 (four) times daily. 02/08/18   Terrilee Files, MD  cyclobenzaprine (FLEXERIL) 5 MG tablet Take 1 tablet (5 mg total) by mouth 3 (three) times daily as needed for muscle spasms. 10/20/19   Oralia Manis, DO  diclofenac Sodium (VOLTAREN) 1 % GEL Apply 2  g topically 4 (four) times daily. 10/20/19   Oralia Manis, DO  doxycycline (VIBRAMYCIN) 100 MG capsule Take 1 capsule (100 mg total) by mouth 2 (two) times daily. 09/27/18   Emi Holes, PA-C  erythromycin ophthalmic ointment Place a 1/2 inch ribbon of ointment into the right lower eyelid TID. 10/07/17   Vanetta Mulders, MD  fluconazole (DIFLUCAN) 150 MG tablet Take 1 tablet (150 mg total) by mouth daily. Take as needed in the setting of a yeast infection after antibiotic completion 04/15/21   Kommor, Wyn Forster, MD  fluticasone [facility]) 50 MCG/ACT nasal spray Place 2 sprays into both nostrils daily. 05/03/21   Sloan Leiter, DO  metroNIDAZOLE (FLAGYL) 500 MG tablet Take 1 tablet (500 mg total) by mouth 2 (two) times daily. 01/10/19   Gwyneth Sprout, MD  metroNIDAZOLE (FLAGYL) 500 MG tablet Take 1 tablet (500 mg total) by mouth 2 (two) times daily. 04/15/21   Kommor, Madison, MD  oseltamivir (TAMIFLU) 75 MG capsule Take 1 capsule (75 mg total) by mouth every 12 (twelve) hours. 05/03/21   Sloan Leiter, DO      Allergies    Patient has no known allergies.    Review of Systems   Review of Systems  Constitutional:  Negative for fever.  Gastrointestinal:  Negative for abdominal pain, nausea and vomiting.  Genitourinary:  Negative for flank pain and vaginal discharge.    Physical Exam Updated Vital Signs BP (!) 145/94 (BP Location: Left Arm)   Pulse  75   Temp 98.2 F (36.8 C)   Resp 18   Ht 5\' 4"  (1.626 m)   Wt 94.3 kg   SpO2 100%   BMI 35.70 kg/m  Physical Exam Constitutional:      Appearance: Normal appearance. She is well-developed.  HENT:     Head: Normocephalic and atraumatic.  Eyes:     Conjunctiva/sclera: Conjunctivae normal.  Cardiovascular:     Rate and Rhythm: Normal rate and regular rhythm.  Pulmonary:     Effort: Pulmonary effort is normal.     Breath sounds: Normal breath sounds.  Abdominal:     Tenderness: There is no abdominal tenderness. There is no  guarding or rebound.  Musculoskeletal:     Cervical back: Neck supple.  Skin:    General: Skin is warm and dry.  Neurological:     Mental Status: She is alert.     GCS: GCS eye subscore is 4. GCS verbal subscore is 5. GCS motor subscore is 6.     ED Results / Procedures / Treatments   Labs (all labs ordered are listed, but only abnormal results are displayed) Labs Reviewed  URINALYSIS, ROUTINE W REFLEX MICROSCOPIC  PREGNANCY, URINE    EKG None  Radiology No results found.  Procedures Procedures  {Document cardiac monitor, telemetry assessment procedure when appropriate:1}  Medications Ordered in ED Medications - No data to display  ED Course/ Medical Decision Making/ A&P   {   Click here for ABCD2, HEART and other calculatorsREFRESH Note before signing :1}                              Medical Decision Making Amount and/or Complexity of Data Reviewed Labs: ordered.   This patient complains of ***; this involves an extensive number of treatment Options and is a complaint that carries with it a high risk of complications and morbidity. The differential includes ***  I ordered, reviewed and interpreted labs, which included *** I ordered medication *** and reviewed PMP when indicated. I ordered imaging studies which included *** and I independently    visualized and interpreted imaging which showed *** Additional history obtained from *** Previous records obtained and reviewed *** I consulted *** and discussed lab and imaging findings and discussed disposition.  Cardiac monitoring reviewed, *** Social determinants considered, *** Critical Interventions: ***  After the interventions stated above, I reevaluated the patient and found *** Admission and further testing considered, ***   {Document critical care time when appropriate:1} {Document review of labs and clinical decision tools ie heart score, Chads2Vasc2 etc:1}  {Document your independent review of  radiology images, and any outside records:1} {Document your discussion with family members, caretakers, and with consultants:1} {Document social determinants of health affecting pt's care:1} {Document your decision making why or why not admission, treatments were needed:1} Final Clinical Impression(s) / ED Diagnoses Final diagnoses:  None    Rx / DC Orders ED Discharge Orders     None

## 2023-02-18 NOTE — Discharge Instructions (Signed)
You were seen in the emergency department for burning with urination.  Your urinalysis showed possible signs of infection and we are starting you on some medication.  Please finish the medication completely and stay well-hydrated.  Follow-up with your regular doctor.  Return to the emergency department if any worsening or concerning symptoms

## 2023-08-31 ENCOUNTER — Encounter (HOSPITAL_BASED_OUTPATIENT_CLINIC_OR_DEPARTMENT_OTHER): Payer: Self-pay

## 2023-08-31 ENCOUNTER — Other Ambulatory Visit: Payer: Self-pay

## 2023-08-31 ENCOUNTER — Emergency Department (HOSPITAL_BASED_OUTPATIENT_CLINIC_OR_DEPARTMENT_OTHER)
Admission: EM | Admit: 2023-08-31 | Discharge: 2023-08-31 | Discharge status: Against Medical Advice | Payer: BC Managed Care – PPO | Attending: Emergency Medicine | Admitting: Emergency Medicine

## 2023-08-31 DIAGNOSIS — B3731 Acute candidiasis of vulva and vagina: Secondary | ICD-10-CM | POA: Insufficient documentation

## 2023-08-31 DIAGNOSIS — Z5321 Procedure and treatment not carried out due to patient leaving prior to being seen by health care provider: Secondary | ICD-10-CM | POA: Insufficient documentation

## 2023-08-31 LAB — URINALYSIS, MICROSCOPIC (REFLEX)

## 2023-08-31 LAB — URINALYSIS, ROUTINE W REFLEX MICROSCOPIC
Bilirubin Urine: NEGATIVE
Glucose, UA: NEGATIVE mg/dL
Ketones, ur: NEGATIVE mg/dL
Nitrite: NEGATIVE
Protein, ur: 30 mg/dL — AB
Specific Gravity, Urine: >=1.03 (ref 1.005–1.030)
pH: 5.5 (ref 5.0–8.0)

## 2023-08-31 LAB — PREGNANCY, URINE: Preg Test, Ur: NEGATIVE

## 2023-08-31 NOTE — ED Triage Notes (Signed)
 Patient here POV from Home.  Endorses possible yeast infection. Noted 3 weeks ago noted some symptoms that became better but did not fully resolve. Worsened 2-3 days ago.   Took Diflucan yesterday at 1330. Main symptom is irritation. No Dysuria. No N/V/D.   NAD Noted during Triage. A&Ox4. GCS 15. Ambulatory.

## 2023-08-31 NOTE — ED Notes (Signed)
Called for room x 3, no answer 

## 2024-01-30 ENCOUNTER — Other Ambulatory Visit: Payer: Self-pay

## 2024-01-30 ENCOUNTER — Encounter (HOSPITAL_BASED_OUTPATIENT_CLINIC_OR_DEPARTMENT_OTHER): Payer: Self-pay | Admitting: Emergency Medicine

## 2024-01-30 ENCOUNTER — Emergency Department (HOSPITAL_BASED_OUTPATIENT_CLINIC_OR_DEPARTMENT_OTHER)
Admission: EM | Admit: 2024-01-30 | Discharge: 2024-01-30 | Disposition: A | Discharge status: Home / Self Care | Payer: Self-pay | Attending: Emergency Medicine | Admitting: Emergency Medicine

## 2024-01-30 DIAGNOSIS — S70361A Insect bite (nonvenomous), right thigh, initial encounter: Secondary | ICD-10-CM | POA: Insufficient documentation

## 2024-01-30 DIAGNOSIS — W57XXXA Bitten or stung by nonvenomous insect and other nonvenomous arthropods, initial encounter: Secondary | ICD-10-CM | POA: Insufficient documentation

## 2024-01-30 MED ORDER — CEPHALEXIN 500 MG PO CAPS
500.0000 mg | ORAL_CAPSULE | Freq: Four times a day (QID) | ORAL | 0 refills | Status: DC
Start: 2024-01-30 — End: 2024-01-30

## 2024-01-30 MED ORDER — CEPHALEXIN 250 MG PO CAPS
500.0000 mg | ORAL_CAPSULE | Freq: Once | ORAL | Status: AC
  Administered 2024-01-30: 500 mg via ORAL
  Filled 2024-01-30: qty 2

## 2024-01-30 MED ORDER — CEPHALEXIN 500 MG PO CAPS: 500.0000 mg | ORAL_CAPSULE | Freq: Four times a day (QID) | ORAL | 0 refills | Status: AC

## 2024-01-30 NOTE — ED Triage Notes (Signed)
 Pt c/o spider bite to RT buttock x 2 days

## 2024-01-30 NOTE — Discharge Instructions (Signed)
 Take Keflex  as directed in the event this is early infection. If the area grows substantially, becomes significantly more painful, has purulent drainage or new concern, return to the ED or see your doctor for recheck. Tylenol  and/or ibuprofen  for discomfort.

## 2024-01-30 NOTE — ED Provider Notes (Signed)
 [place] EMERGENCY DEPARTMENT AT [facility] Provider Note   CSN: [id number] Arrival date & time: 01/30/24  1402     Patient presents with: Insect Bite   Nancy Harper is a 33 y.o. female.   Patient to ED with what she thinks is a spider bite to the lower right buttock that happened 2 days ago. She was sitting on her couch and felt a sharp sting that is now swollen and continues to be painful. No drainage of fever.   The history is provided by the patient. No language interpreter was used.       Prior to Admission medications   Medication Sig Start Date End Date Taking? Authorizing Provider  cephALEXin  (KEFLEX ) 500 MG capsule Take 1 capsule (500 mg total) by mouth 4 (four) times daily. 01/30/24  Yes Margo Lama, Margit, PA-C  acetaminophen  (TYLENOL ) 325 MG tablet Take 2 tablets (650 mg total) by mouth every 6 (six) hours as needed. 05/03/21   Elnor Savant A, DO  cyclobenzaprine  (FLEXERIL ) 5 MG tablet Take 1 tablet (5 mg total) by mouth 3 (three) times daily as needed for muscle spasms. 10/20/19   Erle Hails, DO  diclofenac  Sodium (VOLTAREN ) 1 % GEL Apply 2 g topically 4 (four) times daily. 10/20/19   Erle Hails, DO  doxycycline  (VIBRAMYCIN ) 100 MG capsule Take 1 capsule (100 mg total) by mouth 2 (two) times daily. 09/27/18   Law, Alexandra M, PA-C  erythromycin  ophthalmic ointment Place a 1/2 inch ribbon of ointment into the right lower eyelid TID. 10/07/17   Zackowski, Scott, MD  fluconazole  (DIFLUCAN ) 150 MG tablet Take 1 tablet (150 mg total) by mouth daily. Take as needed in the setting of a yeast infection after antibiotic completion 04/15/21   Kommor, Lum, MD  fluticasone  (FLONASE ) 50 MCG/ACT nasal spray Place 2 sprays into both nostrils daily. 05/03/21   Elnor Savant LABOR, DO  metroNIDAZOLE  (FLAGYL ) 500 MG tablet Take 1 tablet (500 mg total) by mouth 2 (two) times daily. 01/10/19   Doretha Folks, MD  metroNIDAZOLE  (FLAGYL ) 500 MG tablet Take 1 tablet (500 mg total)  by mouth 2 (two) times daily. 04/15/21   Kommor, Madison, MD  oseltamivir  (TAMIFLU ) 75 MG capsule Take 1 capsule (75 mg total) by mouth every 12 (twelve) hours. 05/03/21   Elnor Savant LABOR, DO  phenazopyridine  (PYRIDIUM ) 200 MG tablet Take 1 tablet (200 mg total) by mouth 3 (three) times daily. 02/18/23   Towana Ozell BROCKS, MD    Allergies: Patient has no known allergies.    Review of Systems  Updated Vital Signs BP 135/83   Pulse 91   Temp 98 F (36.7 C) (Oral)   Resp 20   Ht 5' 3 (1.6 m)   Wt 93 kg   SpO2 99%   BMI 36.31 kg/m   Physical Exam Vitals and nursing note reviewed.  Constitutional:      General: She is not in acute distress.    Appearance: She is well-developed. She is not ill-appearing.  Pulmonary:     Effort: Pulmonary effort is normal.  Musculoskeletal:        General: Normal range of motion.     Cervical back: Normal range of motion.  Skin:    General: Skin is warm and dry.     Comments: Small nodule to right infection buttock without fluctuance or erythema. There is a small central ulceration. Mildly tender.   Neurological:     Mental Status: She is alert and oriented to  person, place, and time.     (all labs ordered are listed, but only abnormal results are displayed) Labs Reviewed - No data to display  EKG: None  Radiology: No results found.   Procedures   Medications Ordered in the ED  cephALEXin  (KEFLEX ) capsule 500 mg (has no administration in time range)    Clinical Course as of 01/30/24 1452  Sat Jan 30, 2024  1447 Patient question spider or insect bite to right buttock 2 days ago. There is a nodular, sore area to buttock with central ulceration which is consistent with a bite but no abscess that requires I&D. Will cover with Keflex  and discuss return precautions that might indicate abscess formation.  [SU]    Clinical Course User Index [SU] Odell Balls, PA-C                                 Medical Decision Making        Final diagnoses:  Insect bite of right thigh, initial encounter    ED Discharge Orders          Ordered    cephALEXin  (KEFLEX ) 500 MG capsule  4 times daily        01/30/24 1452               Odell Balls, PA-C 01/30/24 1452    Armenta Canning, MD 01/31/24 [phone]

## 2024-03-15 ENCOUNTER — Emergency Department (HOSPITAL_BASED_OUTPATIENT_CLINIC_OR_DEPARTMENT_OTHER)
Admission: EM | Admit: 2024-03-15 | Discharge: 2024-03-15 | Disposition: A | Discharge status: Home / Self Care | Payer: Self-pay

## 2024-03-15 ENCOUNTER — Other Ambulatory Visit: Payer: Self-pay

## 2024-03-15 ENCOUNTER — Encounter (HOSPITAL_BASED_OUTPATIENT_CLINIC_OR_DEPARTMENT_OTHER): Payer: Self-pay | Admitting: Emergency Medicine

## 2024-03-15 DIAGNOSIS — Z202 Contact with and (suspected) exposure to infections with a predominantly sexual mode of transmission: Secondary | ICD-10-CM | POA: Insufficient documentation

## 2024-03-15 DIAGNOSIS — N760 Acute vaginitis: Secondary | ICD-10-CM | POA: Insufficient documentation

## 2024-03-15 LAB — WET PREP, GENITAL
Clue Cells Wet Prep HPF POC: NONE SEEN
Sperm: NONE SEEN
Trich, Wet Prep: NONE SEEN
WBC, Wet Prep HPF POC: <10 (ref <10)
Yeast Wet Prep HPF POC: NONE SEEN

## 2024-03-15 LAB — URINALYSIS, ROUTINE W REFLEX MICROSCOPIC
Bilirubin Urine: NEGATIVE
Glucose, UA: >=500 mg/dL — AB
Ketones, ur: 15 mg/dL — AB
Leukocytes,Ua: NEGATIVE
Nitrite: NEGATIVE
Protein, ur: NEGATIVE mg/dL
Specific Gravity, Urine: >=1.03 (ref 1.005–1.030)
pH: 5.5 (ref 5.0–8.0)

## 2024-03-15 LAB — URINALYSIS, MICROSCOPIC (REFLEX)

## 2024-03-15 LAB — PREGNANCY, URINE: Preg Test, Ur: NEGATIVE

## 2024-03-15 MED ORDER — DOXYCYCLINE HYCLATE 100 MG PO TABS
100.0000 mg | ORAL_TABLET | Freq: Once | ORAL | Status: AC
  Administered 2024-03-15: 100 mg via ORAL
  Filled 2024-03-15: qty 1

## 2024-03-15 MED ORDER — CEFTRIAXONE SODIUM 1 G IJ SOLR
1.0000 g | Freq: Once | INTRAMUSCULAR | Status: AC
  Administered 2024-03-15: 1 g via INTRAMUSCULAR
  Filled 2024-03-15: qty 10

## 2024-03-15 MED ORDER — LIDOCAINE HCL (PF) 1 % IJ SOLN
INTRAMUSCULAR | Status: AC
  Administered 2024-03-15: 5 mL
  Filled 2024-03-15: qty 5

## 2024-03-15 MED ORDER — DOXYCYCLINE HYCLATE 100 MG PO CAPS
100.0000 mg | ORAL_CAPSULE | Freq: Two times a day (BID) | ORAL | 0 refills | Status: AC
Start: 2024-03-15 — End: 2024-03-22

## 2024-03-15 NOTE — Discharge Instructions (Addendum)
 You have no evidence of yeast infection, BV or urinary tract infection.  Your STD testing is still pending.  You were treated in the ER with Rocephin  and given a prescription for doxycycline .  Please continue to take this until you get your STD testing results back.  If they are negative you can stop your doxycycline .  Avoid sexual contact until you have completed your antibiotics or have negative test results.

## 2024-03-15 NOTE — ED Notes (Signed)
 Discharge instructions reviewed with patient. Patient verbalizes understanding, no further questions at this time. Medications/prescriptions and follow up information provided. No acute distress noted at time of departure.

## 2024-03-15 NOTE — ED Triage Notes (Signed)
 Pt reports vaginal discharge x 2 weeks. Pain while wiping after bathroom.   Denies dysuria, fever.

## 2024-03-15 NOTE — ED Provider Notes (Signed)
 [place] [facility] Provider Note   CSN: [id number] Arrival date & time: 03/15/24  1234     Patient presents with: Vaginal Discharge   Nancy Harper is a 33 y.o. female.   33 year old female presents for evaluation of burning with urination and vaginal discharge.  She states she is mildly concerned for STDs.  States she was recently treated for yeast infection that did not help.  Denies any vaginal bleeding.  States it hurt this morning when she wipes.  Denies any other symptoms or concerns at this time.   Vaginal Discharge Associated symptoms: no abdominal pain, no dysuria, no fever and no vomiting        Prior to Admission medications   Medication Sig Start Date End Date Taking? Authorizing Provider  doxycycline  (VIBRAMYCIN ) 100 MG capsule Take 1 capsule (100 mg total) by mouth 2 (two) times daily for 7 days. 03/15/24 03/22/24 Yes Brazen Domangue L, DO  acetaminophen  (TYLENOL ) 325 MG tablet Take 2 tablets (650 mg total) by mouth every 6 (six) hours as needed. 05/03/21   Elnor Jayson LABOR, DO  cephALEXin  (KEFLEX ) 500 MG capsule Take 1 capsule (500 mg total) by mouth 4 (four) times daily. 01/30/24   Horton, Roxie HERO, DO  cyclobenzaprine  (FLEXERIL ) 5 MG tablet Take 1 tablet (5 mg total) by mouth 3 (three) times daily as needed for muscle spasms. 10/20/19   Erle Hails, DO  diclofenac  Sodium (VOLTAREN ) 1 % GEL Apply 2 g topically 4 (four) times daily. 10/20/19   Erle Hails, DO  doxycycline  (VIBRAMYCIN ) 100 MG capsule Take 1 capsule (100 mg total) by mouth 2 (two) times daily. 09/27/18   Law, Alexandra M, PA-C  erythromycin  ophthalmic ointment Place a 1/2 inch ribbon of ointment into the right lower eyelid TID. 10/07/17   Zackowski, Scott, MD  fluconazole  (DIFLUCAN ) 150 MG tablet Take 1 tablet (150 mg total) by mouth daily. Take as needed in the setting of a yeast infection after antibiotic completion 04/15/21   Kommor, Lum, MD  fluticasone  (FLONASE )  50 MCG/ACT nasal spray Place 2 sprays into both nostrils daily. 05/03/21   Elnor Jayson LABOR, DO  metroNIDAZOLE  (FLAGYL ) 500 MG tablet Take 1 tablet (500 mg total) by mouth 2 (two) times daily. 01/10/19   Doretha Folks, MD  metroNIDAZOLE  (FLAGYL ) 500 MG tablet Take 1 tablet (500 mg total) by mouth 2 (two) times daily. 04/15/21   Kommor, Madison, MD  oseltamivir  (TAMIFLU ) 75 MG capsule Take 1 capsule (75 mg total) by mouth every 12 (twelve) hours. 05/03/21   Elnor Jayson LABOR, DO  phenazopyridine  (PYRIDIUM ) 200 MG tablet Take 1 tablet (200 mg total) by mouth 3 (three) times daily. 02/18/23   Towana Ozell BROCKS, MD    Allergies: Patient has no known allergies.    Review of Systems  Constitutional:  Negative for chills and fever.  HENT:  Negative for ear pain and sore throat.   Eyes:  Negative for pain and visual disturbance.  Respiratory:  Negative for cough and shortness of breath.   Cardiovascular:  Negative for chest pain and palpitations.  Gastrointestinal:  Negative for abdominal pain and vomiting.  Genitourinary:  Positive for vaginal discharge. Negative for dysuria and hematuria.  Musculoskeletal:  Negative for arthralgias and back pain.  Skin:  Negative for color change and rash.  Neurological:  Negative for seizures and syncope.  All other systems reviewed and are negative.   Updated Vital Signs BP 137/89 (BP Location: Left Arm)  Pulse 78   Temp 98.3 F (36.8 C) (Oral)   Resp 15   Ht 5' 3 (1.6 m)   Wt 94.3 kg   SpO2 100%   BMI 36.85 kg/m   Physical Exam Vitals and nursing note reviewed.  Constitutional:      General: She is not in acute distress.    Appearance: Normal appearance. She is well-developed. She is not ill-appearing.  HENT:     Head: Normocephalic and atraumatic.  Eyes:     Conjunctiva/sclera: Conjunctivae normal.  Cardiovascular:     Rate and Rhythm: Normal rate and regular rhythm.     Heart sounds: No murmur heard. Pulmonary:     Effort: Pulmonary effort  is normal. No respiratory distress.     Breath sounds: Normal breath sounds.  Abdominal:     Palpations: Abdomen is soft.     Tenderness: There is no abdominal tenderness.  Musculoskeletal:        General: No swelling.     Cervical back: Neck supple.  Skin:    General: Skin is warm and dry.     Capillary Refill: Capillary refill takes less than 2 seconds.  Neurological:     Mental Status: She is alert.  Psychiatric:        Mood and Affect: Mood normal.     (all labs ordered are listed, but only abnormal results are displayed) Labs Reviewed  URINALYSIS, ROUTINE W REFLEX MICROSCOPIC - Abnormal; Notable for the following components:      Result Value   Glucose, UA >=500 (*)    Hgb urine dipstick MODERATE (*)    Ketones, ur 15 (*)    All other components within normal limits  URINALYSIS, MICROSCOPIC (REFLEX) - Abnormal; Notable for the following components:   Bacteria, UA RARE (*)    All other components within normal limits  WET PREP, GENITAL  PREGNANCY, URINE  GC/CHLAMYDIA PROBE AMP ([place]) NOT AT [facility]    EKG: None  Radiology: No results found.   Procedures   Medications Ordered in the ED  cefTRIAXone  (ROCEPHIN ) injection 1 g (1 g Intramuscular Given 03/15/24 1357)  doxycycline  (VIBRA -TABS) tablet 100 mg (100 mg Oral Given 03/15/24 1357)  lidocaine  (PF) (XYLOCAINE ) 1 % injection (5 mLs  Given 03/15/24 1357)                                    Medical Decision Making Patient here for dysuria and vaginal discomfort.  Negative for UTI and wet prep is negative.  Will treat her for STDs with Rocephin  and doxycycline .  Given a prescription for doxycycline  and instructions to avoid sexual contact until she is negative results or finishes the course of antibiotics.  And otherwise follow-up with her primary care doctor.  She feels comfortable with the plan to be discharged home.  Problems Addressed: Acute vaginitis: acute illness or injury Encounter for assessment of STD  exposure: acute illness or injury  Amount and/or Complexity of Data Reviewed External Data Reviewed: notes.    Details: Prior ED records reviewed by me and patient seen in July 2025 for insect bite Labs: ordered. Decision-making details documented in ED Course.    Details: Ordered and reviewed by me and patient's wet prep and UA is negative  Risk OTC drugs. Prescription drug management.     Final diagnoses:  Acute vaginitis  Encounter for assessment of STD exposure    ED  Discharge Orders          Ordered    doxycycline  (VIBRAMYCIN ) 100 MG capsule  2 times daily        03/15/24 1457               Emilyann Banka L, DO 03/15/24 1500

## 2024-03-16 LAB — GC/CHLAMYDIA PROBE AMP (~~LOC~~) NOT AT ARMC
Chlamydia: NEGATIVE
Comment: NEGATIVE
Comment: NORMAL
Neisseria Gonorrhea: NEGATIVE
